# Patient Record
Sex: Female | Born: 1970 | Race: White | Hispanic: No | Marital: Married | Smoking: Never smoker
Health system: Southern US, Community
[De-identification: ages and names within clinical notes are randomized; demographics above are authoritative.]

## PROBLEM LIST (undated history)

## (undated) DIAGNOSIS — M8580 Other specified disorders of bone density and structure, unspecified site: Secondary | ICD-10-CM

## (undated) HISTORY — PX: OTHER SURGICAL HISTORY: SHX169

## (undated) HISTORY — DX: Other specified disorders of bone density and structure, unspecified site: M85.80

---

## 2000-01-21 ENCOUNTER — Other Ambulatory Visit: Admission: RE | Admit: 2000-01-21 | Discharge: 2000-01-21 | Payer: Self-pay | Admitting: Obstetrics & Gynecology

## 2001-01-24 ENCOUNTER — Other Ambulatory Visit: Admission: RE | Admit: 2001-01-24 | Discharge: 2001-01-24 | Payer: Self-pay | Admitting: Obstetrics & Gynecology

## 2001-10-20 ENCOUNTER — Other Ambulatory Visit: Admission: RE | Admit: 2001-10-20 | Discharge: 2001-10-20 | Payer: Self-pay | Admitting: Obstetrics & Gynecology

## 2002-05-21 ENCOUNTER — Inpatient Hospital Stay (HOSPITAL_COMMUNITY): Admission: AD | Admit: 2002-05-21 | Discharge: 2002-05-24 | Payer: Self-pay | Admitting: Obstetrics & Gynecology

## 2002-06-01 ENCOUNTER — Encounter: Admission: RE | Admit: 2002-06-01 | Discharge: 2002-06-01 | Payer: Self-pay | Admitting: Obstetrics & Gynecology

## 2002-06-01 ENCOUNTER — Encounter: Payer: Self-pay | Admitting: Obstetrics & Gynecology

## 2002-06-29 ENCOUNTER — Other Ambulatory Visit: Admission: RE | Admit: 2002-06-29 | Discharge: 2002-06-29 | Payer: Self-pay | Admitting: Obstetrics & Gynecology

## 2003-02-15 ENCOUNTER — Other Ambulatory Visit: Admission: RE | Admit: 2003-02-15 | Discharge: 2003-02-15 | Payer: Self-pay | Admitting: Family Medicine

## 2004-03-13 ENCOUNTER — Encounter: Admission: RE | Admit: 2004-03-13 | Discharge: 2004-03-13 | Payer: Self-pay | Admitting: Obstetrics & Gynecology

## 2004-09-22 ENCOUNTER — Inpatient Hospital Stay (HOSPITAL_COMMUNITY): Admission: AD | Admit: 2004-09-22 | Discharge: 2004-09-24 | Payer: Self-pay | Admitting: Obstetrics & Gynecology

## 2010-02-24 ENCOUNTER — Encounter: Admission: RE | Admit: 2010-02-24 | Discharge: 2010-02-24 | Payer: Self-pay | Admitting: Obstetrics & Gynecology

## 2010-02-27 ENCOUNTER — Encounter: Admission: RE | Admit: 2010-02-27 | Discharge: 2010-02-27 | Payer: Self-pay | Admitting: Obstetrics & Gynecology

## 2010-10-16 NOTE — H&P (Signed)
NAMESOPHYA, VANBLARCOM                 ACCOUNT NO.:  [id number]   MEDICAL RECORD NO.:  [id number]          PATIENT TYPE:  INP   LOCATION:  9166                          FACILITY:  [facility]   PHYSICIAN:  Lenoard Aden, M.D.DATE OF BIRTH:  1970-10-07   DATE OF ADMISSION:  09/22/2004  DATE OF DISCHARGE:                                HISTORY & PHYSICAL   CHIEF COMPLAINT:  Labor.   HISTORY OF PRESENT ILLNESS:  A 40 year old white female, G2, P1, at 40-1/7th-  week, in active labor.   ALLERGIES:  PENICILLIN.   MEDICATIONS:  Prenatal vitamins.   She has a history of:  1.  Vaginal delivery x 1.  2.  Postpartum depression.   FAMILY HISTORY:  Stroke, osteoarthritis, osteoporosis, thyroid disease, and  congestive heart failure.   PRENATAL LAB DATA:  Blood type O positive.  __________ negative.  Rubella  immune.  Hepatitis HIV nonreactive.  GBS is negative.   PHYSICAL EXAMINATION:  GENERAL:  Well-developed, well-nourished, white  female in no acute distress.  HEENT:  Normal.  LUNGS:  Clear.  HEART:  Regular.  ABDOMEN:  Soft, gravida, nontender.  PELVIC:  The cervix is fully dilated.  LOA +2.  EXTREMITIES:  __________.  NEUROLOGIC:  Nonfocal.   IMPRESSION:  Term intrauterine pregnancy in active labor.   PLAN:  Anticipate attempts at vaginal delivery.     RJT/MEDQ  D:  09/22/2004  T:  09/22/2004  Job:  [id number]   cc:   Ma Hillock

## 2010-10-16 NOTE — Op Note (Signed)
   Kristi [name], Kristi [name]                             ACCOUNT NO.:  [id number]   MEDICAL RECORD NO.:  [id number]                   PATIENT TYPE:  INP   LOCATION:  9175                                 FACILITY:  [facility]   PHYSICIAN:  Ephesus B. Earlene Plater, M.D.               DATE OF BIRTH:  Dec 30, 1970   DATE OF PROCEDURE:  05/22/2002  DATE OF DISCHARGE:                                 OPERATIVE REPORT   PREOPERATIVE DIAGNOSES:  1. Term intrauterine pregnancy with spontaneous labor.  2. Severe repetitive variable decelerations.   PROCEDURE:  Low vacuum assisted vaginal delivery with the Mityvac mushroom  cup.   SURGEON:  Chester Holstein. Earlene Plater, M.D.   ANESTHESIA:  Epidural.   FINDINGS:  Viable female infant with associated body cord.  Apgars 8 and 10.  Compound presentation with an arm.   INDICATIONS:  The patient presented in spontaneous labor.  Progressed to  complete/complete +2 and with pushing was noted to have severe repetitive  variable decelerations.  I therefore recommend we proceed with operative  vaginal delivery.   PROCEDURE:  The patient was in labor and delivery room with epidural  anesthesia.  Placed in the lithotomy position.  Bladder was emptied with red  rubber catheter.  Examination under anesthesia confirmed the position as  outlined above.  The Mityvac mushroom cup was applied to the fetal vertex in  the midline just anterior to the posterior fontanelle.  With three  contractions and no pop-off the fetal vertex was delivered without  difficulty.  The pressure was released between contractions.  Compound  presentation with posterior arm was noted.  A second degree episiotomy was  made to facilitate the delivery.  This extended to a partial third degree.  The remainder of the infant was delivered without difficulty, the cord  clamped and cut, and the infant handed off to the waiting NICU staff.   Placenta was expelled spontaneously and the perineum inspected.  There was a  partial third degree extension.  This portion was repaired with figure-of-  eight interrupted 2-0 Vicryl sutures.  The remainder of the repair was  closed in a standard fashion with 2-0 and 3-0 Vicryl.   The patient tolerated procedure well and there were no apparent injuries to  the newborn.                                               Gerri Spore B. Earlene Plater, M.D.     WBD/MEDQ  D:  05/22/2002  T:  05/22/2002  Job:  [id number]

## 2010-10-16 NOTE — H&P (Signed)
   NAMEAARIN, [name]                             ACCOUNT NO.:  [id number]   MEDICAL RECORD NO.:  [id number]                   PATIENT TYPE:  INP   LOCATION:  9175                                 [facility]ACILITY:  [facility]   PHYSICIAN:  Addison B. Earlene Plater, M.D.               DATE OF BIRTH:  03/06/1971   DATE OF ADMISSION:  05/22/2002  DATE OF DISCHARGE:                                HISTORY & PHYSICAL   ADMISSION DIAGNOSES:  1. Term intrauterine pregnancy.  2. Spontaneous labor.   HISTORY OF PRESENT ILLNESS:  A 40 year old white female gravida 1, para 0 at  39+ weeks presents in spontaneous labor.  Was evaluated in maternity  admissions and progressed from 1 cm to 5 cm after one hour of walking.  Subsequently admitted for management of labor.  Prenatal care [facility], Dr. Seymour Bars, uncomplicated.   PAST MEDICAL HISTORY:  1. Asthma, mild, no recent symptoms.  2. Migraines.   PAST SURGICAL HISTORY:  Wisdom teeth.   FAMILY HISTORY:  Heart disease, diabetes, thyroid dysfunction.   SOCIAL HISTORY:  No alcohol, tobacco, or other drugs.   MEDICATIONS:  Prenatal vitamins.   ALLERGIES:  PENICILLIN causes a rash.   PRENATAL LABORATORIES:  O+.  Rubella equivocal.  Glucola normal.  Group B  Strep negative.   PHYSICAL EXAMINATION:  VITAL SIGNS:  The patient was noted to be afebrile  with stable vitals on admission.  HEART:  Regular rate and rhythm.  LUNGS:  Clear to auscultation.  ABDOMEN:  Gravid.  Fundal height and estimation to weight around 8 pounds.  PELVIC:  Cervical examination as outlined above.  Fetal heart rate in the  140s and reactive with contractions every five to six minutes.    ASSESSMENT:  1. Term intrauterine pregnancy.  2. Spontaneous labor.   PLAN:  Admission for management of labor.                                               Gerri Spore B. Earlene Plater, M.D.    WBD/MEDQ  D:  05/22/2002  T:  05/22/2002  Job:  [id number]

## 2011-02-03 ENCOUNTER — Other Ambulatory Visit: Payer: Self-pay | Admitting: Obstetrics & Gynecology

## 2011-02-03 DIAGNOSIS — Z1231 Encounter for screening mammogram for malignant neoplasm of breast: Secondary | ICD-10-CM

## 2011-03-05 ENCOUNTER — Ambulatory Visit
Admission: RE | Admit: 2011-03-05 | Discharge: 2011-03-05 | Disposition: A | Discharge status: Home / Self Care | Payer: BC Managed Care – PPO | Source: Ambulatory Visit | Attending: Obstetrics & Gynecology | Admitting: Obstetrics & Gynecology

## 2011-03-05 DIAGNOSIS — Z1231 Encounter for screening mammogram for malignant neoplasm of breast: Secondary | ICD-10-CM

## 2012-02-01 ENCOUNTER — Other Ambulatory Visit: Payer: Self-pay | Admitting: Obstetrics & Gynecology

## 2012-02-01 DIAGNOSIS — Z1231 Encounter for screening mammogram for malignant neoplasm of breast: Secondary | ICD-10-CM

## 2012-03-07 ENCOUNTER — Ambulatory Visit
Admission: RE | Admit: 2012-03-07 | Discharge: 2012-03-07 | Disposition: A | Discharge status: Home / Self Care | Payer: BC Managed Care – PPO | Source: Ambulatory Visit | Attending: Obstetrics & Gynecology | Admitting: Obstetrics & Gynecology

## 2012-03-07 DIAGNOSIS — Z1231 Encounter for screening mammogram for malignant neoplasm of breast: Secondary | ICD-10-CM

## 2013-02-21 ENCOUNTER — Other Ambulatory Visit: Payer: Self-pay

## 2013-02-21 DIAGNOSIS — Z1231 Encounter for screening mammogram for malignant neoplasm of breast: Secondary | ICD-10-CM

## 2013-03-12 ENCOUNTER — Ambulatory Visit
Admission: RE | Admit: 2013-03-12 | Discharge: 2013-03-12 | Disposition: A | Discharge status: Home / Self Care | Payer: BC Managed Care – PPO | Source: Ambulatory Visit

## 2013-03-12 DIAGNOSIS — Z1231 Encounter for screening mammogram for malignant neoplasm of breast: Secondary | ICD-10-CM

## 2013-08-15 ENCOUNTER — Other Ambulatory Visit: Payer: Self-pay | Admitting: Family

## 2013-08-15 DIAGNOSIS — R10811 Right upper quadrant abdominal tenderness: Secondary | ICD-10-CM

## 2013-08-21 ENCOUNTER — Ambulatory Visit
Admission: RE | Admit: 2013-08-21 | Discharge: 2013-08-21 | Disposition: A | Discharge status: Home / Self Care | Payer: BC Managed Care – PPO | Source: Ambulatory Visit | Attending: Family | Admitting: Family

## 2013-08-21 DIAGNOSIS — R10811 Right upper quadrant abdominal tenderness: Secondary | ICD-10-CM

## 2014-02-18 ENCOUNTER — Other Ambulatory Visit: Payer: Self-pay

## 2014-02-18 DIAGNOSIS — Z1231 Encounter for screening mammogram for malignant neoplasm of breast: Secondary | ICD-10-CM

## 2014-03-15 ENCOUNTER — Ambulatory Visit: Payer: BC Managed Care – PPO

## 2014-03-22 ENCOUNTER — Ambulatory Visit
Admission: RE | Admit: 2014-03-22 | Discharge: 2014-03-22 | Disposition: A | Discharge status: Home / Self Care | Payer: BC Managed Care – PPO | Source: Ambulatory Visit

## 2014-03-22 DIAGNOSIS — Z1231 Encounter for screening mammogram for malignant neoplasm of breast: Secondary | ICD-10-CM

## 2015-02-14 ENCOUNTER — Other Ambulatory Visit: Payer: Self-pay

## 2015-02-14 DIAGNOSIS — Z1231 Encounter for screening mammogram for malignant neoplasm of breast: Secondary | ICD-10-CM

## 2015-03-24 ENCOUNTER — Ambulatory Visit: Payer: Self-pay

## 2015-10-23 IMAGING — US US ABDOMEN LIMITED
1 series · 14 of 25 positions shown · non-contrast
Comparison: None.

CLINICAL DATA: Right upper quadrant abdominal tenderness

EXAM:
US ABDOMEN LIMITED - RIGHT UPPER QUADRANT

[Series 1: us abdomen limited · 0.24mm/px · 14 of 73 slices shown]
[im 1/73]
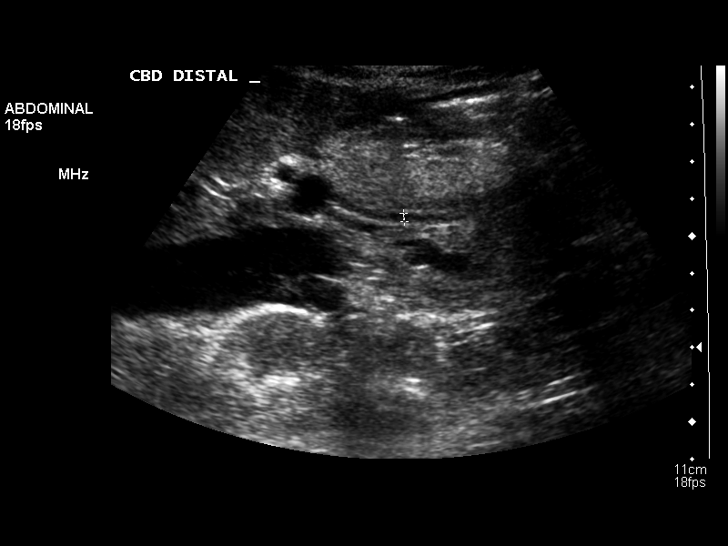
[im 7/73]
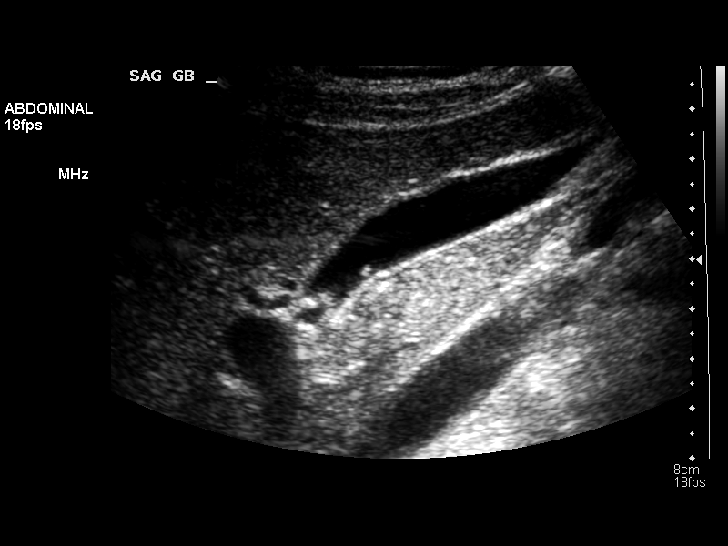
[im 13/73]
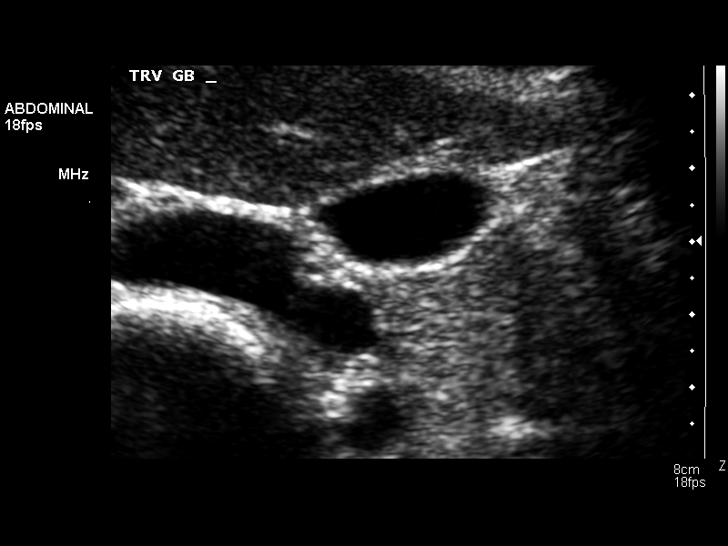
[im 19/73]
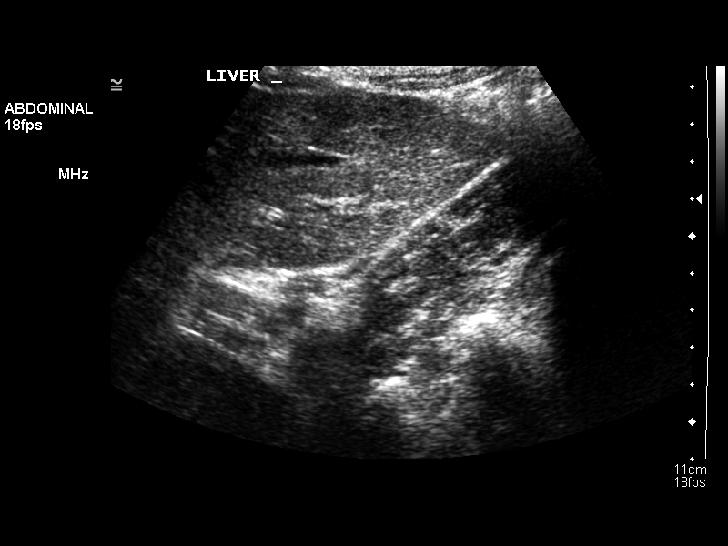
[im 25/73]
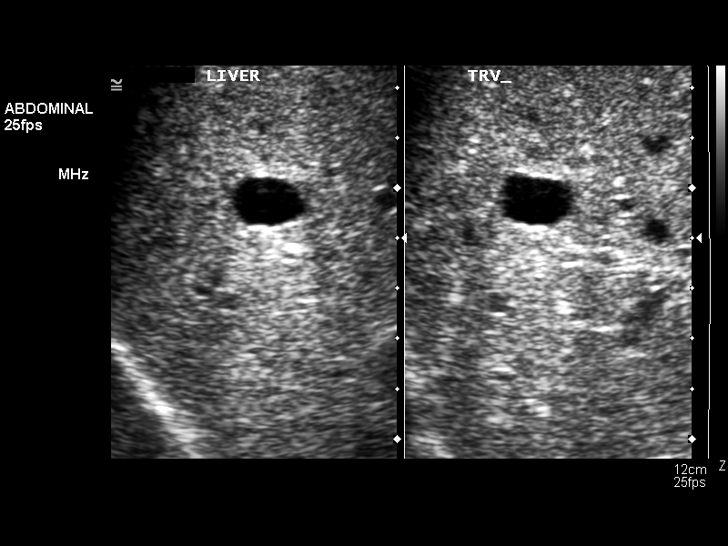
[im 28/73]
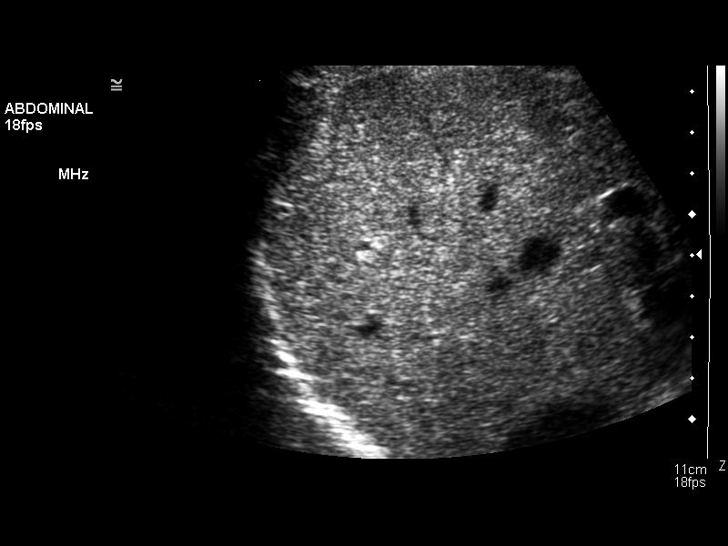
[im 34/73]
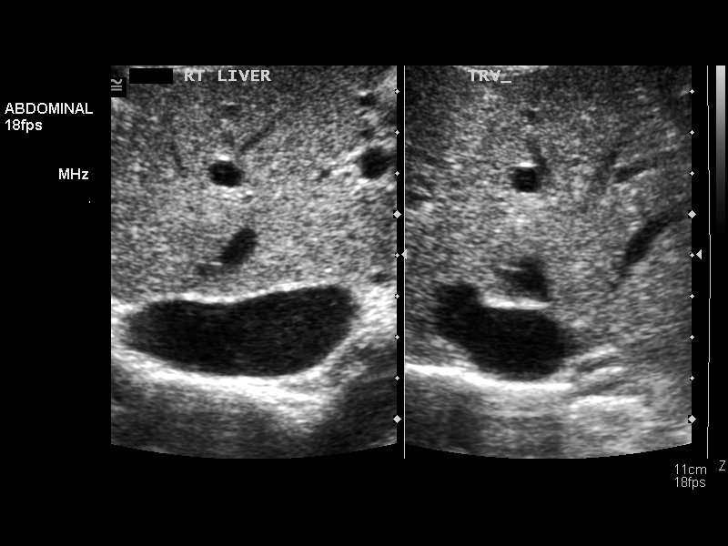
[im 40/73]
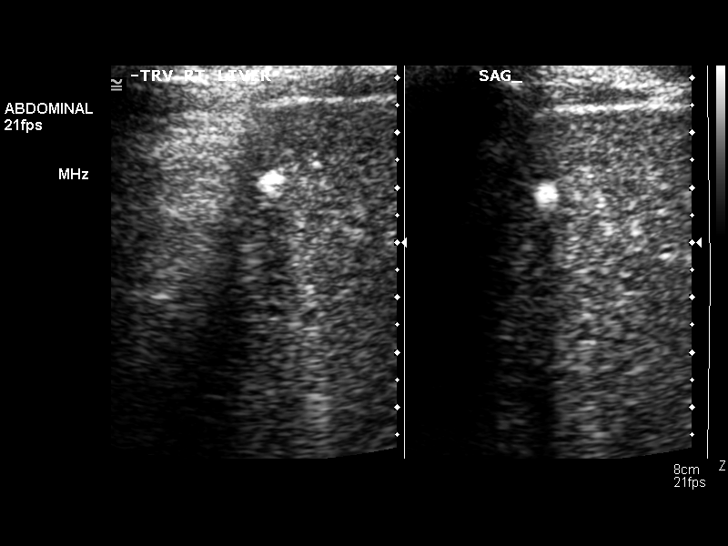
[im 46/73]
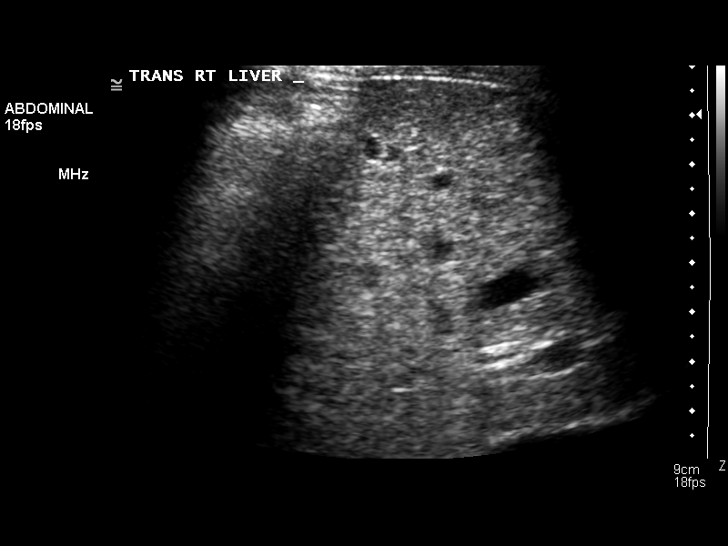
[im 49/73]
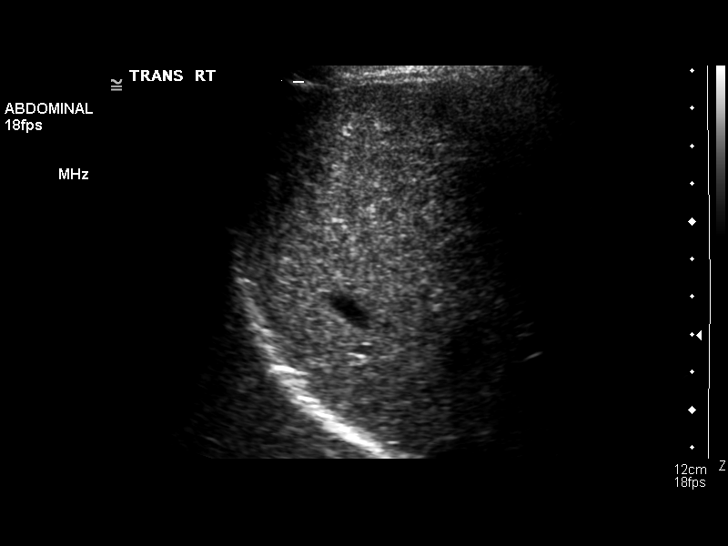
[im 55/73]
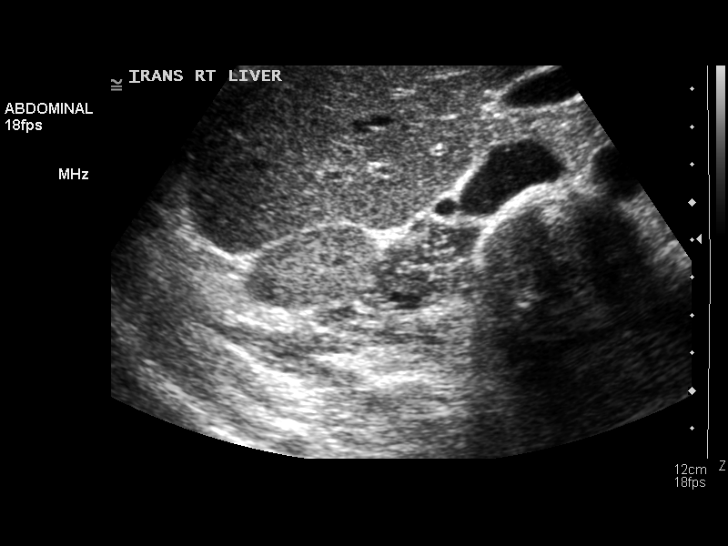
[im 61/73]
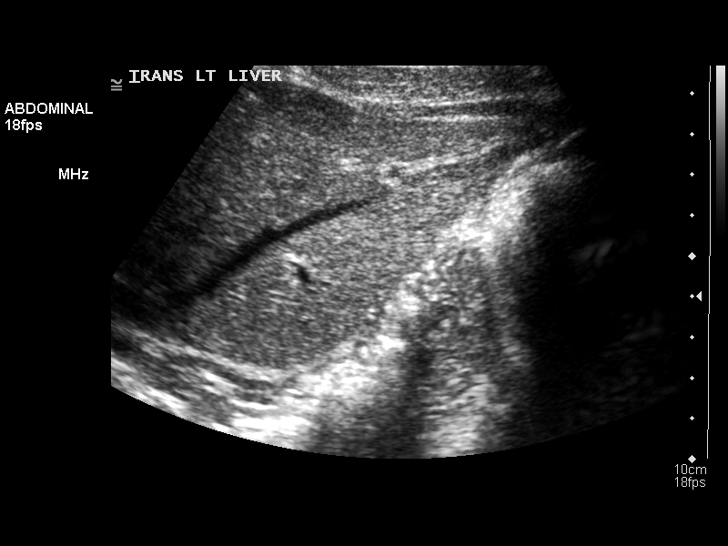
[im 67/73]
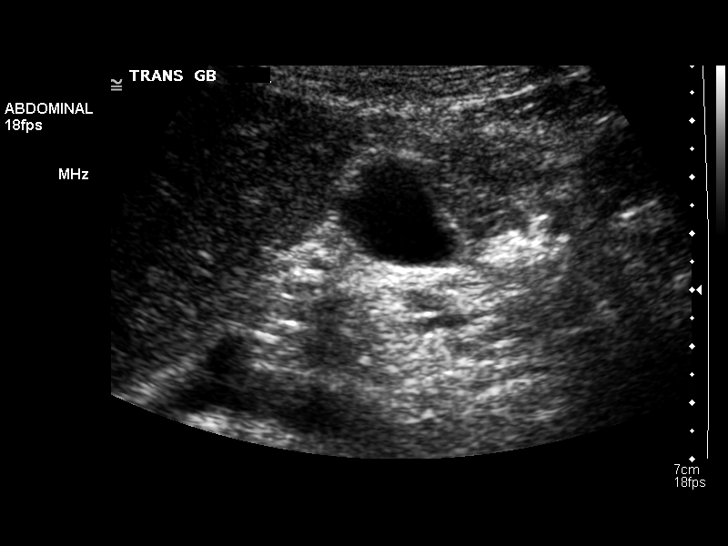
[im 73/73]
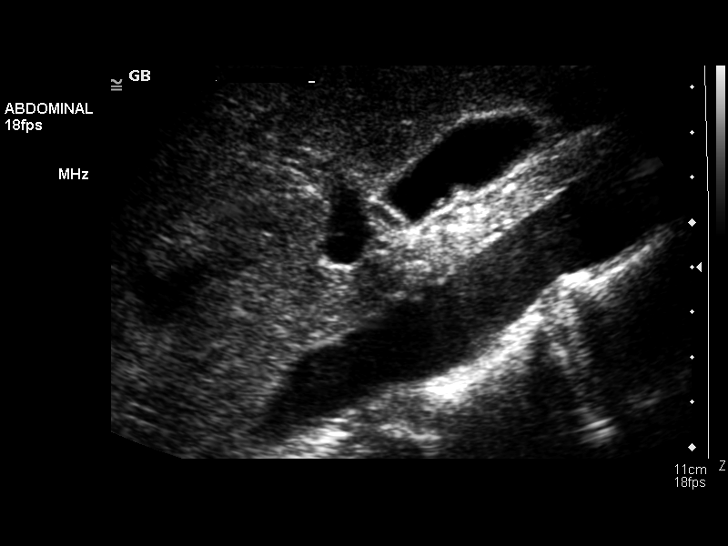

[14 of 25 positions shown; findings below may reference images not displayed]

FINDINGS: Gallbladder:

There are 3 non mobile echodensity is within the gallbladder with no
shadowing common most consistent with gallbladder polyps no larger
than 4 mm in diameter. No definite gallstones are seen, and there is
no pain over the gallbladder with compression.

Common bile duct:

Diameter: The common bile duct is normal measuring 2.2 mm in
diameter distally.

Liver:

The liver is slightly inhomogeneous which may indicate mild fatty
infiltration. Several liver cysts are present, the largest in the
right lobe of 1.5 cm. Also a 6 mm calcification is noted in the
right lobe with acoustical shadowing consistent with a hepatic
granuloma.
IMPRESSION: 1. Three gallbladder polyps.  No definite gallstones.
2. Inhomogeneous liver may represent mild fatty infiltration.
3. Multiple liver cysts.

## 2016-11-10 ENCOUNTER — Ambulatory Visit (INDEPENDENT_AMBULATORY_CARE_PROVIDER_SITE_OTHER): Payer: BLUE CROSS/BLUE SHIELD | Admitting: Obstetrics & Gynecology

## 2016-11-10 ENCOUNTER — Telehealth: Payer: Self-pay | Admitting: *Deleted

## 2016-11-10 ENCOUNTER — Encounter: Payer: Self-pay | Admitting: Obstetrics & Gynecology

## 2016-11-10 VITALS — BP 112/70

## 2016-11-10 DIAGNOSIS — N898 Other specified noninflammatory disorders of vagina: Secondary | ICD-10-CM | POA: Diagnosis not present

## 2016-11-10 DIAGNOSIS — L292 Pruritus vulvae: Secondary | ICD-10-CM

## 2016-11-10 LAB — WET PREP FOR TRICH, YEAST, CLUE
Clue Cells Wet Prep HPF POC: NONE SEEN
Trich, Wet Prep: NONE SEEN

## 2016-11-10 MED ORDER — TERCONAZOLE 0.8 % VA CREA: 1.0000 | TOPICAL_CREAM | Freq: Every day | VAGINAL | 1 refills | Status: AC

## 2016-11-10 NOTE — Progress Notes (Signed)
    Kristi [name] August 06, 1970 [id number]        46 y.o.  G2P2  Married.    RP:  Vulvovaginal itching x a few days with d/c.    HPI:  Vaginal d/c with itching at vulva and in vaginal.  Had a little Terconazole cream left and used it on vulva which helped, but Sxs still present.  No pelvic pain.  Well on BCPs.  No abnormal bleeding.  [facility].    Past medical history,surgical history, problem list, medications, allergies, family history and social history were all reviewed and documented in the EPIC chart.  Directed ROS with pertinent positives and negatives documented in the history of present illness/assessment and plan.  Exam:  Vitals:   11/10/16 1610  BP: 112/70   General appearance:  Normal  Gyn exam:  Vulva Mild erythema                     Speculum:  Vaginal d/c c/w yeast.  Wet prep done.  Assessment/Plan:  46 y.o.   1. Vaginal discharge Typical of yeast infection  - WET PREP FOR TRICH, YEAST, CLUE:  Yeasts present  2. Vulvovaginal itching Vulvovaginal yeast infection.  Terconazole 0.8% HS x 3 prescribed.    Counseling on above issues >50% x 15 minutes.   Genia DelMarie-Lyne Skyy Mcknight MD, 4:18 PM 11/10/2016

## 2016-11-10 NOTE — Telephone Encounter (Signed)
Pt called c/o yeast infection itching and white discharge x 3 days, states she had old Rx for Terazol cream you had prescribed only had 2 days left, Pt paper chart is not here yet, she is going to have it sent.I explained to her not sure if you would approve medication with no chart, but I will check. Pt [facility], pt asked if you would be willing to prescribe Terazol cream again? Please advise

## 2016-11-10 NOTE — Patient Instructions (Signed)
1. Vaginal discharge Typical of yeast infection  - WET PREP FOR TRICH, YEAST, CLUE:  Yeasts present  2. Vulvovaginal itching Vulvovaginal yeast infection.  Terconazole 0.8% HS x 3 prescribed.    Kristi [name], it was a pleasure to see you today!

## 2016-12-10 ENCOUNTER — Encounter: Payer: Self-pay | Admitting: Obstetrics & Gynecology

## 2016-12-10 ENCOUNTER — Ambulatory Visit (INDEPENDENT_AMBULATORY_CARE_PROVIDER_SITE_OTHER): Payer: BLUE CROSS/BLUE SHIELD | Admitting: Obstetrics & Gynecology

## 2016-12-10 VITALS — BP 120/80

## 2016-12-10 DIAGNOSIS — L298 Other pruritus: Secondary | ICD-10-CM

## 2016-12-10 DIAGNOSIS — N898 Other specified noninflammatory disorders of vagina: Secondary | ICD-10-CM

## 2016-12-10 LAB — WET PREP FOR TRICH, YEAST, CLUE
Clue Cells Wet Prep HPF POC: NONE SEEN
Trich, Wet Prep: NONE SEEN
Yeast Wet Prep HPF POC: NONE SEEN

## 2016-12-10 NOTE — Progress Notes (Signed)
    Kristi [name] 1970/08/03 [id number]        46 y.o.  G2P2 Married  RP:  Recurrent vaginal d/c with itching  HPI:  Yeast vaginitis with pos Wet prep 11/10/2016, treated with Terazol cream.  Got better x 1 week and then started being symptomatic again and retreated with Terazol.  Again, better x about 1-2 weeks and vaginal/vulvar itching with increased whitish secretions reappeared x >1 week.  Takes Lactobacilli powder per mouth.  Read the book on Yeast infections...  Not sexually active x many years.  Past medical history,surgical history, problem list, medications, allergies, family history and social history were all reviewed and documented in the EPIC chart.  Directed ROS with pertinent positives and negatives documented in the history of present illness/assessment and plan.  Exam:  Vitals:   12/10/16 1455  BP: 120/80   General appearance:  Normal  Gyn exam:  Vulva normal, no erythema, no lesion, no discharge seen.                     Speculum:  Mild white vaginal d/c.  Cervix/vaginal mucosa normal.  Wet prep done.  Assessment/Plan:  46 y.o.   1. Vaginal discharge Wet prep negative.  Reassured, no yeast vulvovaginitis.  Normal gyn exam.  Will try to stop focusing on Yeast symptoms and see if feels better.  2. Vaginal itching Minimal currently.  Counseling on above issues >50% x 15 minutes.  Kristi DelMarie-Lyne Nanette Wirsing MD, 2:59 PM 12/10/2016

## 2016-12-10 NOTE — Patient Instructions (Signed)
1. Vaginal discharge Wet prep negative.  Reassured, no yeast vulvovaginitis.  Normal gyn exam.  Will try to stop focusing on Yeast symptoms and see if feels better.  2. Vaginal itching Minimal currently.  Kristi [name], good to see you today!

## 2017-07-25 ENCOUNTER — Other Ambulatory Visit: Payer: Self-pay | Admitting: Obstetrics & Gynecology

## 2017-07-25 DIAGNOSIS — Z1231 Encounter for screening mammogram for malignant neoplasm of breast: Secondary | ICD-10-CM

## 2017-08-22 ENCOUNTER — Ambulatory Visit
Admission: RE | Admit: 2017-08-22 | Discharge: 2017-08-22 | Disposition: A | Discharge status: Home / Self Care | Payer: BLUE CROSS/BLUE SHIELD | Source: Ambulatory Visit | Attending: Obstetrics & Gynecology | Admitting: Obstetrics & Gynecology

## 2017-08-22 DIAGNOSIS — Z1231 Encounter for screening mammogram for malignant neoplasm of breast: Secondary | ICD-10-CM

## 2017-09-26 ENCOUNTER — Telehealth: Payer: Self-pay | Admitting: *Deleted

## 2017-09-26 MED ORDER — NORETHIN ACE-ETH ESTRAD-FE 1-20 MG-MCG PO TABS: 1.0000 | ORAL_TABLET | Freq: Every day | ORAL | 0 refills | Status: DC

## 2017-09-26 NOTE — Telephone Encounter (Signed)
Patient called requesting refill on Junel Fe 1/20 tablet has annual scheduled on 10/12/17. Rx sent.

## 2017-10-12 ENCOUNTER — Encounter: Payer: Self-pay | Admitting: Obstetrics & Gynecology

## 2017-10-12 ENCOUNTER — Ambulatory Visit (INDEPENDENT_AMBULATORY_CARE_PROVIDER_SITE_OTHER): Payer: BLUE CROSS/BLUE SHIELD | Admitting: Obstetrics & Gynecology

## 2017-10-12 VITALS — BP 108/70 | Ht 64.0 in | Wt 114.0 lb

## 2017-10-12 DIAGNOSIS — Z3041 Encounter for surveillance of contraceptive pills: Secondary | ICD-10-CM | POA: Diagnosis not present

## 2017-10-12 DIAGNOSIS — Z01419 Encounter for gynecological examination (general) (routine) without abnormal findings: Secondary | ICD-10-CM | POA: Diagnosis not present

## 2017-10-12 MED ORDER — NORETHIN ACE-ETH ESTRAD-FE 1-20 MG-MCG PO TABS: 1.0000 | ORAL_TABLET | Freq: Every day | ORAL | 4 refills | Status: DC

## 2017-10-12 NOTE — Progress Notes (Signed)
Kristi [name] 1970-11-22 [id number]   History:    47 y.o. G2P2L2 Married.  Children 43 and 40 yo.  RP:  Established patient presenting for annual gyn exam   HPI: Well on BCP Junel Fe 1/20, using continuously.  No BTB.  No pelvic pain.  No pain with IC.  Urine/BMs wnl.  Breasts wnl.  Exercises regularly.  BMI 19.57.  Health labs with Fam MD.  Past medical history,surgical history, family history and social history were all reviewed and documented in the EPIC chart.  Gynecologic History No LMP recorded. (Menstrual status: Oral contraceptives). Contraception: OCP (estrogen/progesterone) Last Pap: Probably 2018, but last documented from [facility] records is 2016 negative. Last mammogram: 05/2016. Results were: Benign Bone Density: Never Colonoscopy: Never  Obstetric History OB History  Gravida Para Term Preterm AB Living  SAB TAB Ectopic Multiple Live Births               # Outcome Date GA Lbr Len/2nd Weight Sex Delivery Anes PTL Lv  2 Para           1 Para              ROS: A ROS was performed and pertinent positives and negatives are included in the history.  GENERAL: No fevers or chills. HEENT: No change in vision, no earache, sore throat or sinus congestion. NECK: No pain or stiffness. CARDIOVASCULAR: No chest pain or pressure. No palpitations. PULMONARY: No shortness of breath, cough or wheeze. GASTROINTESTINAL: No abdominal pain, nausea, vomiting or diarrhea, melena or bright red blood per rectum. GENITOURINARY: No urinary frequency, urgency, hesitancy or dysuria. MUSCULOSKELETAL: No joint or muscle pain, no back pain, no recent trauma. DERMATOLOGIC: No rash, no itching, no lesions. ENDOCRINE: No polyuria, polydipsia, no heat or cold intolerance. No recent change in weight. HEMATOLOGICAL: No anemia or easy bruising or bleeding. NEUROLOGIC: No headache, seizures, numbness, tingling or weakness. PSYCHIATRIC: No depression, no loss of interest in normal activity or  change in sleep pattern.     Exam:   BP 108/70   Ht  (1.626 m)   Wt 114 lb (51.7 kg)   BMI 19.57 kg/m   Body mass index is 19.57 kg/m.  General appearance : Well developed well nourished female. No acute distress HEENT: Eyes: no retinal hemorrhage or exudates,  Neck supple, trachea midline, no carotid bruits, no thyroidmegaly Lungs: Clear to auscultation, no rhonchi or wheezes, or rib retractions  Heart: Regular rate and rhythm, no murmurs or gallops Breast:Examined in sitting and supine position were symmetrical in appearance, no palpable masses or tenderness,  no skin retraction, no nipple inversion, no nipple discharge, no skin discoloration, no axillary or supraclavicular lymphadenopathy Abdomen: no palpable masses or tenderness, no rebound or guarding Extremities: no edema or skin discoloration or tenderness  Pelvic: Vulva: Normal             Vagina: No gross lesions or discharge  Cervix: No gross lesions or discharge.  Pap reflex done.  Uterus  AV, normal size, shape and consistency, non-tender and mobile  Adnexa  Without masses or tenderness  Anus: Normal   Assessment/Plan:  47 y.o. female for annual exam   1. Encounter for routine gynecological examination with Papanicolaou smear of cervix Normal gynecologic exam.  Pap reflex done.  Breast exam normal.  Patient will schedule screening mammogram now.  Health labs with family physician.  2. Encounter for surveillance of  contraceptive pills Well on continuous Junel 1/20 FE 1/20.  No contraindication.  Same birth control pill represcribed.  Other orders - Multiple Vitamin (MULTIVITAMIN) tablet; Take 1 tablet by mouth daily. - norethindrone-ethinyl estradiol (JUNEL FE 1/20) 1-20 MG-MCG tablet; Take 1 tablet by mouth daily.  Genia Del MD, 4:15 PM 10/12/2017

## 2017-10-13 LAB — PAP IG W/ RFLX HPV ASCU

## 2017-10-14 ENCOUNTER — Encounter: Payer: Self-pay | Admitting: Obstetrics & Gynecology

## 2017-10-14 NOTE — Patient Instructions (Signed)
1. Encounter for routine gynecological examination with Papanicolaou smear of cervix Normal gynecologic exam.  Pap reflex done.  Breast exam normal.  Patient will schedule screening mammogram now.  Health labs with family physician.  2. Encounter for surveillance of contraceptive pills Well on continuous Junel 1/20 FE 1/20.  No contraindication.  Same birth control pill represcribed.  Other orders - Multiple Vitamin (MULTIVITAMIN) tablet; Take 1 tablet by mouth daily. - norethindrone-ethinyl estradiol (JUNEL FE 1/20) 1-20 MG-MCG tablet; Take 1 tablet by mouth daily.  Kristi [name], it was a pleasure seeing you today!  I will inform you of your results as soon as they are available.

## 2018-08-18 ENCOUNTER — Other Ambulatory Visit: Payer: Self-pay | Admitting: Obstetrics & Gynecology

## 2018-08-18 DIAGNOSIS — Z1231 Encounter for screening mammogram for malignant neoplasm of breast: Secondary | ICD-10-CM

## 2018-09-25 ENCOUNTER — Ambulatory Visit: Payer: BLUE CROSS/BLUE SHIELD

## 2018-11-02 ENCOUNTER — Other Ambulatory Visit: Payer: Self-pay

## 2018-11-02 ENCOUNTER — Ambulatory Visit
Admission: RE | Admit: 2018-11-02 | Discharge: 2018-11-02 | Disposition: A | Discharge status: Home / Self Care | Payer: 59 | Source: Ambulatory Visit | Attending: Obstetrics & Gynecology | Admitting: Obstetrics & Gynecology

## 2018-11-02 DIAGNOSIS — Z1231 Encounter for screening mammogram for malignant neoplasm of breast: Secondary | ICD-10-CM

## 2018-11-09 ENCOUNTER — Other Ambulatory Visit: Payer: Self-pay | Admitting: Obstetrics & Gynecology

## 2018-11-09 NOTE — Telephone Encounter (Signed)
Annual on 11/28/18

## 2018-11-27 ENCOUNTER — Other Ambulatory Visit: Payer: Self-pay

## 2018-11-28 ENCOUNTER — Ambulatory Visit (INDEPENDENT_AMBULATORY_CARE_PROVIDER_SITE_OTHER): Payer: 59 | Admitting: Obstetrics & Gynecology

## 2018-11-28 ENCOUNTER — Encounter: Payer: Self-pay | Admitting: Obstetrics & Gynecology

## 2018-11-28 VITALS — BP 116/70 | Ht 64.5 in | Wt 117.0 lb

## 2018-11-28 DIAGNOSIS — Z3041 Encounter for surveillance of contraceptive pills: Secondary | ICD-10-CM

## 2018-11-28 DIAGNOSIS — Z01419 Encounter for gynecological examination (general) (routine) without abnormal findings: Secondary | ICD-10-CM

## 2018-11-28 MED ORDER — NORETHIN ACE-ETH ESTRAD-FE 1-20 MG-MCG PO TABS: 1.0000 | ORAL_TABLET | Freq: Every day | ORAL | 5 refills | Status: DC

## 2018-11-28 NOTE — Progress Notes (Signed)
Kristi [name] [birth year] [id number]   History:    48 y.o. G2P2L2 Married.  2 daughters 68 and 28 yo.  RP:  Established patient presenting for annual gyn exam   HPI: Well on Junel 1/20 continuous use.  Rare light menses.  No pelvic pain.  Abstinent.  Normal vaginal secretions.  Urine/BMs normal.  Breasts normal.  BMI 19.77.  Good fitness.  Healthy nutrition.  Health Labs with Fam MD.  Past medical history,surgical history, family history and social history were all reviewed and documented in the EPIC chart.  Gynecologic History No LMP recorded. (Menstrual status: Oral contraceptives). Contraception: OCP (estrogen/progesterone)/Abstinent Last Pap: 09/2017. Results were: Negative Last mammogram: 10/2018. Results were: Negative Bone Density: Never Colonoscopy: Never  Obstetric History OB History  Gravida Para Term Preterm AB Living  2 2       2   SAB TAB Ectopic Multiple Live Births               # Outcome Date GA Lbr Len/2nd Weight Sex Delivery Anes PTL Lv  2 Para           1 Para              ROS: A ROS was performed and pertinent positives and negatives are included in the history.  GENERAL: No fevers or chills. HEENT: No change in vision, no earache, sore throat or sinus congestion. NECK: No pain or stiffness. CARDIOVASCULAR: No chest pain or pressure. No palpitations. PULMONARY: No shortness of breath, cough or wheeze. GASTROINTESTINAL: No abdominal pain, nausea, vomiting or diarrhea, melena or bright red blood per rectum. GENITOURINARY: No urinary frequency, urgency, hesitancy or dysuria. MUSCULOSKELETAL: No joint or muscle pain, no back pain, no recent trauma. DERMATOLOGIC: No rash, no itching, no lesions. ENDOCRINE: No polyuria, polydipsia, no heat or cold intolerance. No recent change in weight. HEMATOLOGICAL: No anemia or easy bruising or bleeding. NEUROLOGIC: No headache, seizures, numbness, tingling or weakness. PSYCHIATRIC: No depression, no loss of interest in normal  activity or change in sleep pattern.     Exam:   BP 116/70   Ht 5' 4.5" (1.638 m)   Wt 117 lb (53.1 kg)   BMI 19.77 kg/m   Body mass index is 19.77 kg/m.  General appearance : Well developed well nourished female. No acute distress HEENT: Eyes: no retinal hemorrhage or exudates,  Neck supple, trachea midline, no carotid bruits, no thyroidmegaly Lungs: Clear to auscultation, no rhonchi or wheezes, or rib retractions  Heart: Regular rate and rhythm, no murmurs or gallops Breast:Examined in sitting and supine position were symmetrical in appearance, no palpable masses or tenderness,  no skin retraction, no nipple inversion, no nipple discharge, no skin discoloration, no axillary or supraclavicular lymphadenopathy Abdomen: no palpable masses or tenderness, no rebound or guarding Extremities: no edema or skin discoloration or tenderness  Pelvic: Vulva: Normal             Vagina: No gross lesions or discharge  Cervix: No gross lesions or discharge.  Pap reflex done.  Uterus  AV, normal size, shape and consistency, non-tender and mobile  Adnexa  Without masses or tenderness  Anus: Normal   Assessment/Plan:  48 y.o. female for annual exam   1. Encounter for routine gynecological examination with Papanicolaou smear of cervix Normal gynecologic exam.  Pap reflex done.  Breast exam normal.  Screening mammogram June 2020 was negative.  Health labs with family physician.  Body mass index stable at 19.77.  Continue  with fitness and healthy nutrition.  Recommend a screening colonoscopy at age 48.  Patient will call back when ready to schedule.  2. Encounter for surveillance of contraceptive pills Using birth control pill mainly for cycle control.  No contraindications continue.  Junel FE 1/20 represcribed.  Other orders - Biotin 10 MG CAPS; Take by mouth. - norethindrone-ethinyl estradiol (JUNEL FE 1/20) 1-20 MG-MCG tablet; Take 1 tablet by mouth daily. Continuous use  Genia DelMarie-Lyne Annalia Metzger  MD, 4:15 PM 11/28/2018

## 2018-11-28 NOTE — Patient Instructions (Signed)
1. Encounter for routine gynecological examination with Papanicolaou smear of cervix Normal gynecologic exam.  Pap reflex done.  Breast exam normal.  Screening mammogram June 2020 was negative.  Health labs with family physician.  Body mass index stable at 19.77.  Continue with fitness and healthy nutrition.  Recommend a screening colonoscopy at age 48.  Patient will call back when ready to schedule.  2. Encounter for surveillance of contraceptive pills Using birth control pill mainly for cycle control.  No contraindications continue.  Junel FE 1/20 represcribed.  Other orders - Biotin 10 MG CAPS; Take by mouth. - norethindrone-ethinyl estradiol (JUNEL FE 1/20) 1-20 MG-MCG tablet; Take 1 tablet by mouth daily. Continuous use  [name], it was a pleasure seeing you today!  I will inform you of your results as soon as they are available.

## 2018-11-29 NOTE — Addendum Note (Signed)
Addended by: CASTILLO, BLANCA A on: 11/29/2018 09:49 AM   Modules accepted: Orders  

## 2018-11-30 LAB — PAP IG W/ RFLX HPV ASCU

## 2019-02-03 ENCOUNTER — Other Ambulatory Visit: Payer: Self-pay | Admitting: Obstetrics & Gynecology

## 2019-12-04 ENCOUNTER — Other Ambulatory Visit: Payer: Self-pay

## 2019-12-04 ENCOUNTER — Other Ambulatory Visit: Payer: Self-pay | Admitting: Obstetrics & Gynecology

## 2019-12-04 ENCOUNTER — Ambulatory Visit (INDEPENDENT_AMBULATORY_CARE_PROVIDER_SITE_OTHER): Payer: 59 | Admitting: Obstetrics & Gynecology

## 2019-12-04 ENCOUNTER — Encounter: Payer: Self-pay | Admitting: Obstetrics & Gynecology

## 2019-12-04 VITALS — BP 116/72 | Ht 64.25 in | Wt 115.0 lb

## 2019-12-04 DIAGNOSIS — Z01419 Encounter for gynecological examination (general) (routine) without abnormal findings: Secondary | ICD-10-CM | POA: Diagnosis not present

## 2019-12-04 DIAGNOSIS — Z1231 Encounter for screening mammogram for malignant neoplasm of breast: Secondary | ICD-10-CM

## 2019-12-04 DIAGNOSIS — Z3041 Encounter for surveillance of contraceptive pills: Secondary | ICD-10-CM | POA: Diagnosis not present

## 2019-12-04 MED ORDER — NORETHIN ACE-ETH ESTRAD-FE 1-20 MG-MCG PO TABS: 1.0000 | ORAL_TABLET | Freq: Every day | ORAL | 4 refills | Status: DC

## 2019-12-05 ENCOUNTER — Encounter: Payer: Self-pay | Admitting: Obstetrics & Gynecology

## 2019-12-05 NOTE — Patient Instructions (Signed)
1. Well female exam with routine gynecological exam Normal gynecologic exam.  Pap test negative in 2020, no indication to repeat this year.  Breast exam normal.  Scheduled for screening mammogram this month July 2021.  Will schedule a screening colonoscopy through her family physician.  Good body mass index at 19.59.  Continue with fitness and healthy nutrition.  2. Encounter for surveillance of contraceptive pills Well on birth control pill with Junel FE 1/20.  No contraindication to continue.  Prescription sent to pharmacy.  Other orders - loratadine (CLARITIN) 10 MG tablet; Take 10 mg by mouth daily. - norethindrone-ethinyl estradiol (JUNEL FE 1/20) 1-20 MG-MCG tablet; Take 1 tablet by mouth daily.  Veora, it was a pleasure seeing you today!

## 2019-12-05 NOTE — Progress Notes (Signed)
Kristi [name] August 17, 1970 [id number]   History:    49 y.o. G2P2L2 Married.  2 daughters 88 and 59 yo.  RP:  Established patient presenting for annual gyn exam   HPI: Well on Junel 1/20 continuous use.  Rare light menses.  No pelvic pain.  Abstinent.  Normal vaginal secretions.  Abstinent.  Not a very good relationship with husband, but no abuse.  Urine/BMs normal.  Breasts normal.  BMI 19.59.  Good fitness.  Healthy nutrition.  Health Labs with Fam MD.   Past medical history,surgical history, family history and social history were all reviewed and documented in the EPIC chart.  Gynecologic History No LMP recorded. (Menstrual status: Oral contraceptives).  Obstetric History OB History  Gravida Para Term Preterm AB Living  2 2       2   SAB TAB Ectopic Multiple Live Births               # Outcome Date GA Lbr Len/2nd Weight Sex Delivery Anes PTL Lv  2 Para           1 Para              ROS: A ROS was performed and pertinent positives and negatives are included in the history.  GENERAL: No fevers or chills. HEENT: No change in vision, no earache, sore throat or sinus congestion. NECK: No pain or stiffness. CARDIOVASCULAR: No chest pain or pressure. No palpitations. PULMONARY: No shortness of breath, cough or wheeze. GASTROINTESTINAL: No abdominal pain, nausea, vomiting or diarrhea, melena or bright red blood per rectum. GENITOURINARY: No urinary frequency, urgency, hesitancy or dysuria. MUSCULOSKELETAL: No joint or muscle pain, no back pain, no recent trauma. DERMATOLOGIC: No rash, no itching, no lesions. ENDOCRINE: No polyuria, polydipsia, no heat or cold intolerance. No recent change in weight. HEMATOLOGICAL: No anemia or easy bruising or bleeding. NEUROLOGIC: No headache, seizures, numbness, tingling or weakness. PSYCHIATRIC: No depression, no loss of interest in normal activity or change in sleep pattern.     Exam:   BP 116/72   Ht 5' 4.25" (1.632 m)   Wt 115 lb (52.2 kg)    BMI 19.59 kg/m   Body mass index is 19.59 kg/m.  General appearance : Well developed well nourished female. No acute distress HEENT: Eyes: no retinal hemorrhage or exudates,  Neck supple, trachea midline, no carotid bruits, no thyroidmegaly Lungs: Clear to auscultation, no rhonchi or wheezes, or rib retractions  Heart: Regular rate and rhythm, no murmurs or gallops Breast:Examined in sitting and supine position were symmetrical in appearance, no palpable masses or tenderness,  no skin retraction, no nipple inversion, no nipple discharge, no skin discoloration, no axillary or supraclavicular lymphadenopathy Abdomen: no palpable masses or tenderness, no rebound or guarding Extremities: no edema or skin discoloration or tenderness  Pelvic: Vulva: Normal             Vagina: No gross lesions or discharge  Cervix: No gross lesions or discharge  Uterus  AV, normal size, shape and consistency, non-tender and mobile  Adnexa  Without masses or tenderness  Anus: Normal   Assessment/Plan:  49 y.o. female for annual exam   1. Well female exam with routine gynecological exam Normal gynecologic exam.  Pap test negative in 2020, no indication to repeat this year.  Breast exam normal.  Scheduled for screening mammogram this month July 2021.  Will schedule a screening colonoscopy through her family physician.  Good body mass index at 19.59.  Continue with fitness and healthy nutrition.  2. Encounter for surveillance of contraceptive pills Well on birth control pill with Junel FE 1/20.  No contraindication to continue.  Prescription sent to pharmacy.  Other orders - loratadine (CLARITIN) 10 MG tablet; Take 10 mg by mouth daily. - norethindrone-ethinyl estradiol (JUNEL FE 1/20) 1-20 MG-MCG tablet; Take 1 tablet by mouth daily.  Genia Del MD, 4:48 PM 12/04/2019

## 2019-12-19 ENCOUNTER — Ambulatory Visit
Admission: RE | Admit: 2019-12-19 | Discharge: 2019-12-19 | Disposition: A | Discharge status: Home / Self Care | Payer: 59 | Source: Ambulatory Visit | Attending: Obstetrics & Gynecology | Admitting: Obstetrics & Gynecology

## 2019-12-19 ENCOUNTER — Other Ambulatory Visit: Payer: Self-pay

## 2019-12-19 DIAGNOSIS — Z1231 Encounter for screening mammogram for malignant neoplasm of breast: Secondary | ICD-10-CM

## 2020-04-02 LAB — EXTERNAL GENERIC LAB PROCEDURE: COLOGUARD: NEGATIVE

## 2020-12-04 ENCOUNTER — Ambulatory Visit (INDEPENDENT_AMBULATORY_CARE_PROVIDER_SITE_OTHER): Payer: 59 | Admitting: Obstetrics & Gynecology

## 2020-12-04 ENCOUNTER — Other Ambulatory Visit: Payer: Self-pay

## 2020-12-04 ENCOUNTER — Encounter: Payer: Self-pay | Admitting: Obstetrics & Gynecology

## 2020-12-04 ENCOUNTER — Other Ambulatory Visit (HOSPITAL_COMMUNITY)
Admission: RE | Admit: 2020-12-04 | Discharge: 2020-12-04 | Disposition: A | Discharge status: Home / Self Care | Payer: 59 | Source: Ambulatory Visit | Attending: Obstetrics & Gynecology | Admitting: Obstetrics & Gynecology

## 2020-12-04 VITALS — BP 100/74 | HR 78 | Resp 16 | Ht 64.25 in | Wt 117.0 lb

## 2020-12-04 DIAGNOSIS — Z01419 Encounter for gynecological examination (general) (routine) without abnormal findings: Secondary | ICD-10-CM | POA: Insufficient documentation

## 2020-12-04 DIAGNOSIS — Z13820 Encounter for screening for osteoporosis: Secondary | ICD-10-CM

## 2020-12-04 DIAGNOSIS — Z3041 Encounter for surveillance of contraceptive pills: Secondary | ICD-10-CM

## 2020-12-04 MED ORDER — NORETHIN ACE-ETH ESTRAD-FE 1-20 MG-MCG PO TABS: 1.0000 | ORAL_TABLET | Freq: Every day | ORAL | 4 refills | Status: DC

## 2020-12-04 NOTE — Progress Notes (Signed)
Kristi [name] 24-May-1971 [id number]   History:    50 y.o. . G2P2L2 Married.  2 daughters 6 and 63 yo.   RP:  Established patient presenting for annual gyn exam   HPI: Well on Junel 1/20 continuous use.  Rare light menses.  No pelvic pain.  Abstinent.  Normal vaginal secretions.  Abstinent.  Not a very good relationship with husband, but no abuse.  Urine/BMs normal.  Breasts normal.  Strong fam H/O Osteoporosis.  BMI 19.93.  Good fitness.  Healthy nutrition.  Health Labs with Fam MD.   Past medical history,surgical history, family history and social history were all reviewed and documented in the EPIC chart.  Gynecologic History No LMP recorded. (Menstrual status: Oral contraceptives).  Obstetric History OB History  Gravida Para Term Preterm AB Living  2 2       2   SAB IAB Ectopic Multiple Live Births               # Outcome Date GA Lbr Len/2nd Weight Sex Delivery Anes PTL Lv  2 Para           1 Para              ROS: A ROS was performed and pertinent positives and negatives are included in the history.  GENERAL: No fevers or chills. HEENT: No change in vision, no earache, sore throat or sinus congestion. NECK: No pain or stiffness. CARDIOVASCULAR: No chest pain or pressure. No palpitations. PULMONARY: No shortness of breath, cough or wheeze. GASTROINTESTINAL: No abdominal pain, nausea, vomiting or diarrhea, melena or bright red blood per rectum. GENITOURINARY: No urinary frequency, urgency, hesitancy or dysuria. MUSCULOSKELETAL: No joint or muscle pain, no back pain, no recent trauma. DERMATOLOGIC: No rash, no itching, no lesions. ENDOCRINE: No polyuria, polydipsia, no heat or cold intolerance. No recent change in weight. HEMATOLOGICAL: No anemia or easy bruising or bleeding. NEUROLOGIC: No headache, seizures, numbness, tingling or weakness. PSYCHIATRIC: No depression, no loss of interest in normal activity or change in sleep pattern.     Exam:   BP 100/74   Pulse 78   Resp  16   Ht 5' 4.25" (1.632 m)   Wt 117 lb (53.1 kg)   BMI 19.93 kg/m   Body mass index is 19.93 kg/m.  General appearance : Well developed well nourished female. No acute distress HEENT: Eyes: no retinal hemorrhage or exudates,  Neck supple, trachea midline, no carotid bruits, no thyroidmegaly Lungs: Clear to auscultation, no rhonchi or wheezes, or rib retractions  Heart: Regular rate and rhythm, no murmurs or gallops Breast:Examined in sitting and supine position were symmetrical in appearance, no palpable masses or tenderness,  no skin retraction, no nipple inversion, no nipple discharge, no skin discoloration, no axillary or supraclavicular lymphadenopathy Abdomen: no palpable masses or tenderness, no rebound or guarding Extremities: no edema or skin discoloration or tenderness  Pelvic: Vulva: Normal             Vagina: No gross lesions or discharge  Cervix: No gross lesions or discharge.  Pap reflex done.  Uterus  AV, normal size, shape and consistency, non-tender and mobile  Adnexa  Without masses or tenderness  Anus: Normal   Assessment/Plan:  50 y.o. female for annual exam   1. Encounter for routine gynecological examination with Papanicolaou smear of cervix Normal gynecologic exam.  Pap reflex done.  Breast exam normal.  Screening mammogram July 2021 was negative, will schedule now for this year.  Cologuard negative in October 2021.  Body mass index 19.93.  Good fitness and nutrition.  Health labs with family nurse practitioner. - Cytology - PAP( [place])  2. Encounter for surveillance of contraceptive pills Well on Junel Fe 1/20.  No contraindication to continue.  Prescription sent to pharmacy.  3. Screening for osteoporosis Strong family history of osteoporosis.  Patient is a very thin Caucasian.  Probably in perimenopause.  Decision to proceed with a bone density now.  We will schedule at the breast center.  Recommend vitamin D supplements, will check a vitamin D level  with her family nurse practitioner.  Calcium intake of 1.5 g/day total.  Regular weightbearing physical activities recommended. - DG Bone Density; Future  Other orders - desloratadine (CLARINEX) 5 MG tablet; Take by mouth. - fexofenadine (ALLEGRA) 180 MG tablet; Take by mouth. - ACIDOPHILUS LACTOBACILLUS PO; Take by mouth. - Biotin 5 MG TABS; Take by mouth. - Cholecalciferol (VITAMIN D3 PO); Take by mouth. 1000 - Multiple Minerals-Vitamins (CALCIUM-MAGNESIUM-ZINC-D3 PO); Take by mouth. - Ascorbic Acid (VITAMIN C PO); Take 5,000 mg by mouth. - norethindrone-ethinyl estradiol-FE (JUNEL FE 1/20) 1-20 MG-MCG tablet; Take 1 tablet by mouth daily.   Genia Del MD, 4:09 PM 12/04/2020

## 2020-12-05 ENCOUNTER — Encounter: Payer: Self-pay | Admitting: Obstetrics & Gynecology

## 2020-12-09 ENCOUNTER — Telehealth: Payer: Self-pay

## 2020-12-09 NOTE — Telephone Encounter (Signed)
Patient would like to do her bone density screening test at the [facility] because she could do her mammogram there as well.

## 2020-12-10 ENCOUNTER — Other Ambulatory Visit: Payer: Self-pay

## 2020-12-10 DIAGNOSIS — Z13820 Encounter for screening for osteoporosis: Secondary | ICD-10-CM

## 2020-12-10 LAB — CYTOLOGY - PAP
Comment: NEGATIVE
Diagnosis: NEGATIVE
High risk HPV: NEGATIVE

## 2020-12-10 NOTE — Telephone Encounter (Signed)
Left message for patient that order placed and she call and schedule when ready.

## 2020-12-16 ENCOUNTER — Other Ambulatory Visit: Payer: Self-pay | Admitting: Obstetrics & Gynecology

## 2020-12-16 DIAGNOSIS — Z1231 Encounter for screening mammogram for malignant neoplasm of breast: Secondary | ICD-10-CM

## 2020-12-19 ENCOUNTER — Ambulatory Visit
Admission: RE | Admit: 2020-12-19 | Discharge: 2020-12-19 | Disposition: A | Discharge status: Home / Self Care | Payer: 59 | Source: Ambulatory Visit | Attending: Obstetrics & Gynecology | Admitting: Obstetrics & Gynecology

## 2020-12-19 ENCOUNTER — Other Ambulatory Visit: Payer: Self-pay

## 2020-12-19 DIAGNOSIS — Z1231 Encounter for screening mammogram for malignant neoplasm of breast: Secondary | ICD-10-CM

## 2021-01-03 IMAGING — MG DIGITAL SCREENING BILATERAL MAMMOGRAM WITH CAD
4 series · 4 of 4 positions shown · non-contrast
Comparison: Previous exam(s).

CLINICAL DATA: Screening.

EXAM:
DIGITAL SCREENING BILATERAL MAMMOGRAM WITH CAD

[R MLO]
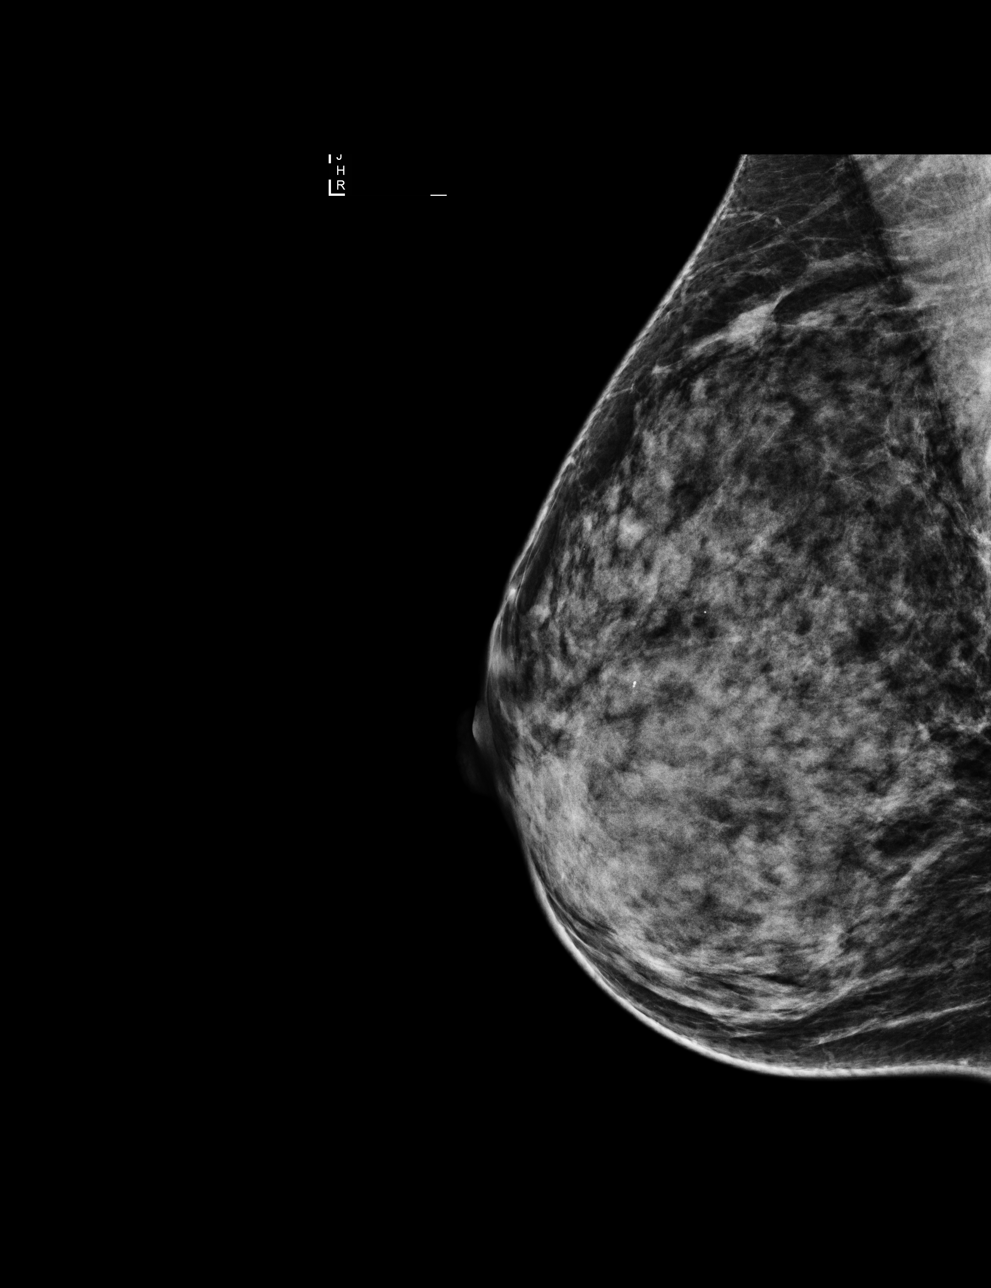

[R CC]
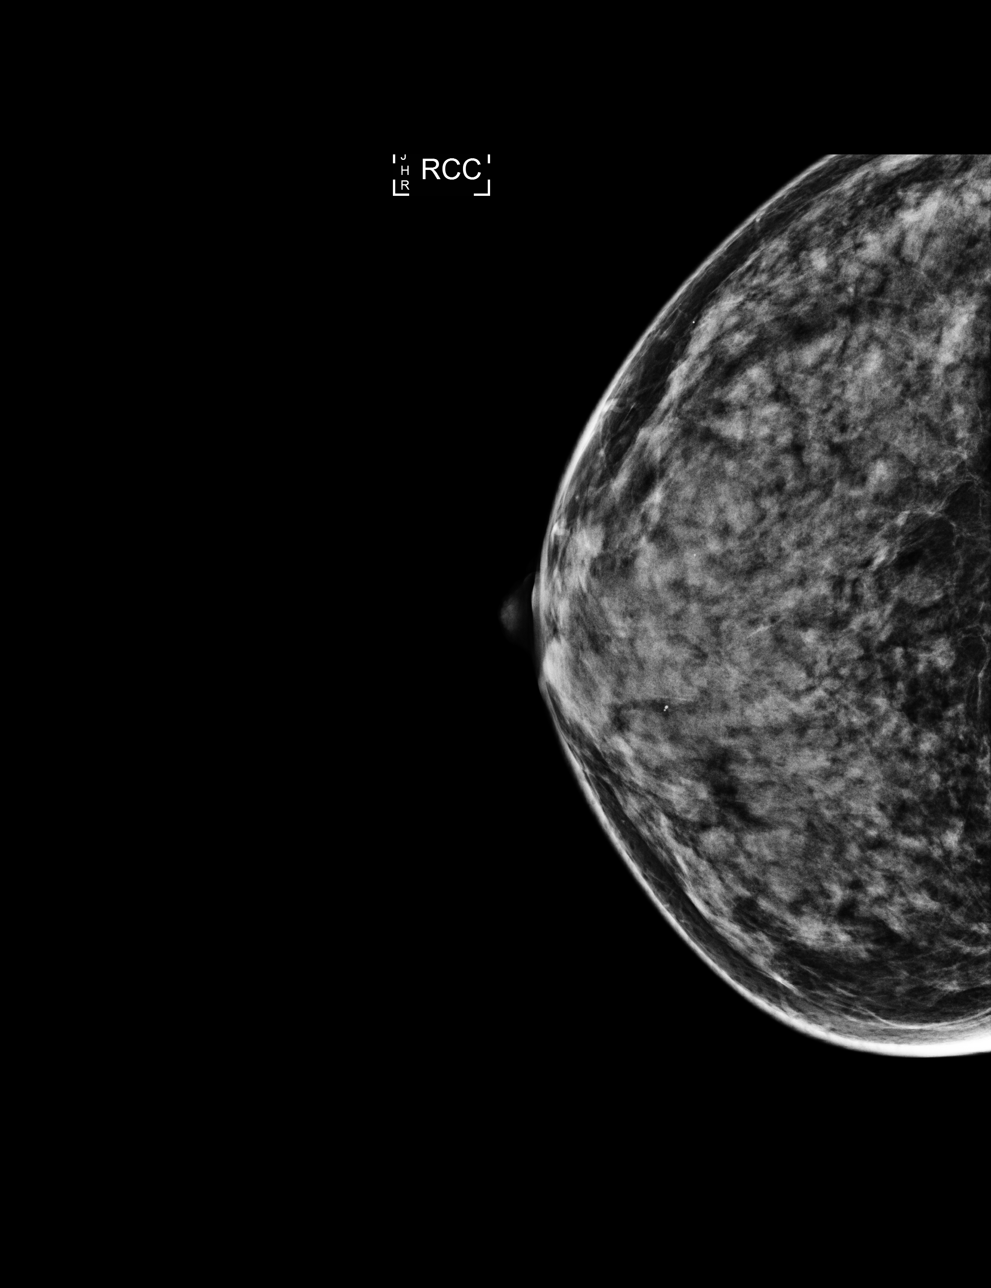

[L MLO]
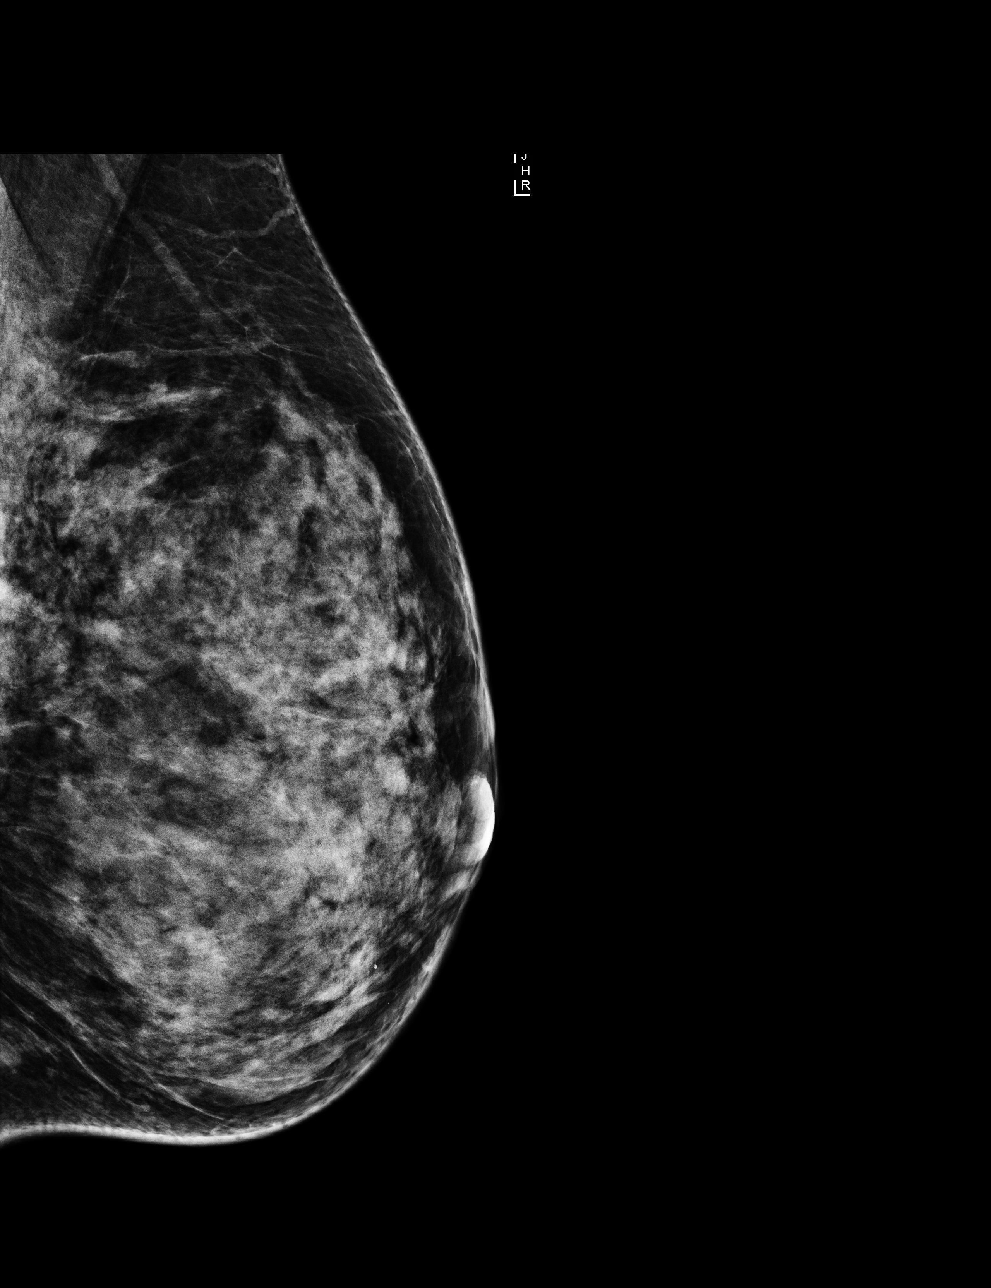

[L CC]
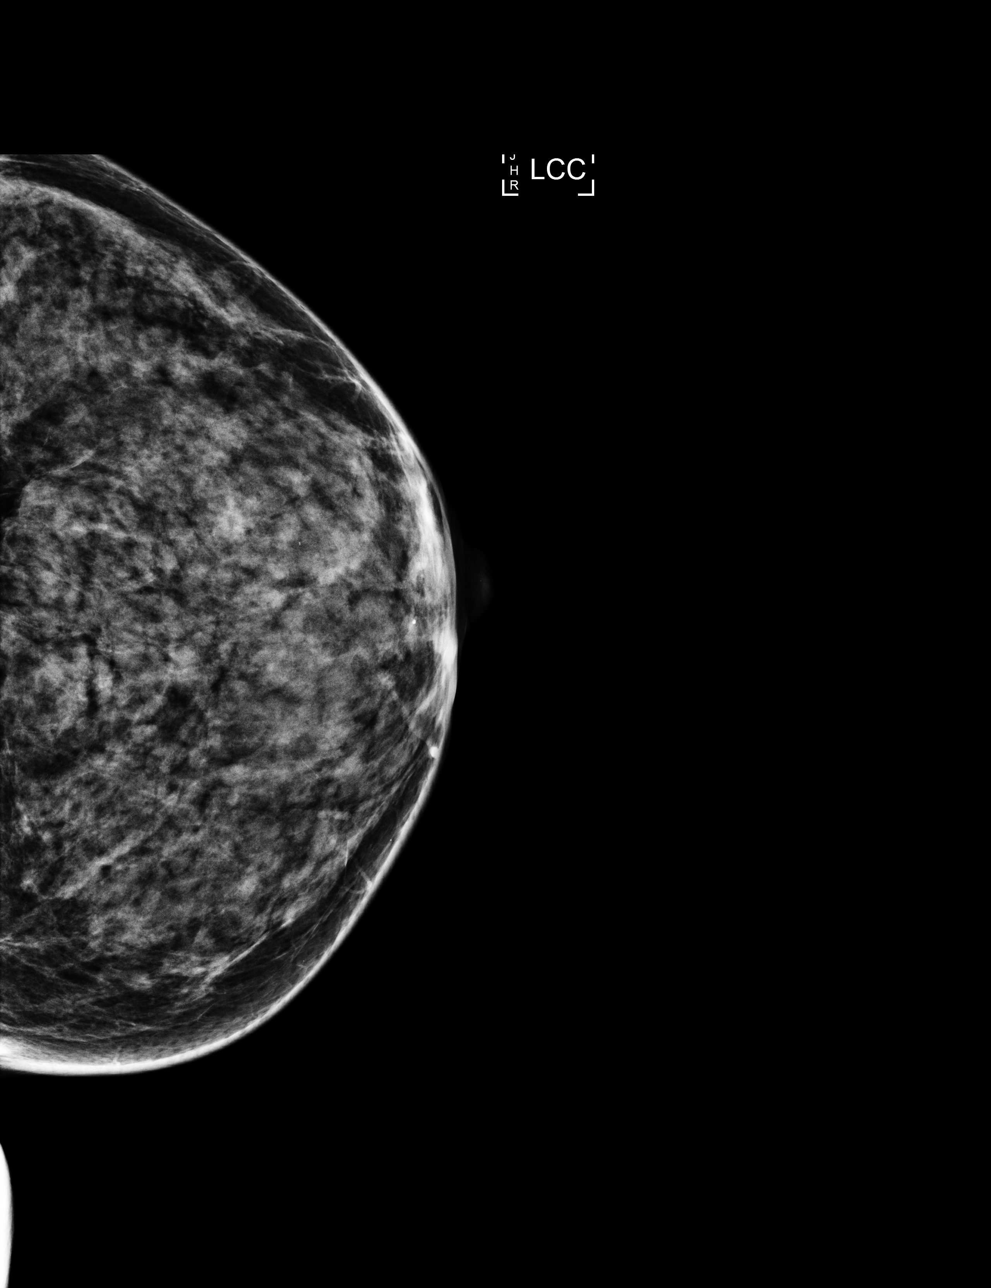

[4 of 4 positions shown; findings below may reference images not displayed]

ACR Breast Density Category c: The breast tissue is heterogeneously
dense, which may obscure small masses.
FINDINGS: There are no findings suspicious for malignancy. Images were
processed with CAD.
IMPRESSION: No mammographic evidence of malignancy. A result letter of this
screening mammogram will be mailed directly to the patient.

RECOMMENDATION:
Screening mammogram in one year. (Code:YJ-2-FEZ)

BI-RADS CATEGORY  1: Negative.

## 2021-01-20 ENCOUNTER — Other Ambulatory Visit: Payer: Self-pay

## 2021-01-20 ENCOUNTER — Ambulatory Visit (INDEPENDENT_AMBULATORY_CARE_PROVIDER_SITE_OTHER): Payer: 59

## 2021-01-20 ENCOUNTER — Other Ambulatory Visit: Payer: Self-pay | Admitting: Obstetrics & Gynecology

## 2021-01-20 DIAGNOSIS — M8589 Other specified disorders of bone density and structure, multiple sites: Secondary | ICD-10-CM

## 2021-01-20 DIAGNOSIS — Z13820 Encounter for screening for osteoporosis: Secondary | ICD-10-CM

## 2021-11-27 ENCOUNTER — Other Ambulatory Visit: Payer: Self-pay | Admitting: Obstetrics & Gynecology

## 2021-11-27 DIAGNOSIS — Z1231 Encounter for screening mammogram for malignant neoplasm of breast: Secondary | ICD-10-CM

## 2021-12-09 ENCOUNTER — Ambulatory Visit: Payer: 59 | Admitting: Obstetrics & Gynecology

## 2021-12-23 ENCOUNTER — Ambulatory Visit
Admission: RE | Admit: 2021-12-23 | Discharge: 2021-12-23 | Disposition: A | Discharge status: Home / Self Care | Payer: Managed Care, Other (non HMO) | Source: Ambulatory Visit | Attending: Obstetrics & Gynecology | Admitting: Obstetrics & Gynecology

## 2021-12-23 DIAGNOSIS — Z1231 Encounter for screening mammogram for malignant neoplasm of breast: Secondary | ICD-10-CM

## 2022-01-13 ENCOUNTER — Ambulatory Visit (INDEPENDENT_AMBULATORY_CARE_PROVIDER_SITE_OTHER): Payer: Managed Care, Other (non HMO) | Admitting: Obstetrics & Gynecology

## 2022-01-13 ENCOUNTER — Encounter: Payer: Self-pay | Admitting: Obstetrics & Gynecology

## 2022-01-13 VITALS — BP 130/80 | Ht 64.0 in | Wt 114.0 lb

## 2022-01-13 DIAGNOSIS — Z01419 Encounter for gynecological examination (general) (routine) without abnormal findings: Secondary | ICD-10-CM

## 2022-01-13 DIAGNOSIS — Z3041 Encounter for surveillance of contraceptive pills: Secondary | ICD-10-CM

## 2022-01-13 DIAGNOSIS — M85852 Other specified disorders of bone density and structure, left thigh: Secondary | ICD-10-CM

## 2022-01-13 MED ORDER — NORETHIN ACE-ETH ESTRAD-FE 1-20 MG-MCG PO TABS: 1.0000 | ORAL_TABLET | Freq: Every day | ORAL | 4 refills | Status: DC

## 2022-01-13 NOTE — Progress Notes (Signed)
Kristi [name] 1970/12/12 [id number]   History:    51 y.o. G2P2L2 Married.  2 daughters 38 and 34 yo.   RP:  Established patient presenting for annual gyn exam   HPI: Well on Junel 1/20 continuous use.  Rare light menses.  No pelvic pain.  Abstinent.  Normal vaginal secretions.  Abstinent. Pap Neg 11/2020.  No h/o abnormal Pap.  Will repeat Pap at 3 years. Not a very good relationship with husband, but no abuse. Urine/BMs normal.  Cologuard 2 yrs ago.  Breasts normal.  Mammo Neg 11/2021.  Strong fam H/O Osteoporosis.  BD Osteopenia 02/2021 with T-Score -1.9 at Lt Fem Neck.  BMI 19.57.  Good fitness.  Healthy nutrition, but needs to increase total calories.  Health Labs with Fam MD.      Past medical history,surgical history, family history and social history were all reviewed and documented in the EPIC chart.  Gynecologic History Patient's last menstrual period was 12/24/2021.  Obstetric History OB History  Gravida Para Term Preterm AB Living  2 2       2   SAB IAB Ectopic Multiple Live Births               # Outcome Date GA Lbr Len/2nd Weight Sex Delivery Anes PTL Lv  2 Para           1 Para              ROS: A ROS was performed and pertinent positives and negatives are included in the history.  GENERAL: No fevers or chills. HEENT: No change in vision, no earache, sore throat or sinus congestion. NECK: No pain or stiffness. CARDIOVASCULAR: No chest pain or pressure. No palpitations. PULMONARY: No shortness of breath, cough or wheeze. GASTROINTESTINAL: No abdominal pain, nausea, vomiting or diarrhea, melena or bright red blood per rectum. GENITOURINARY: No urinary frequency, urgency, hesitancy or dysuria. MUSCULOSKELETAL: No joint or muscle pain, no back pain, no recent trauma. DERMATOLOGIC: No rash, no itching, no lesions. ENDOCRINE: No polyuria, polydipsia, no heat or cold intolerance. No recent change in weight. HEMATOLOGICAL: No anemia or easy bruising or bleeding. NEUROLOGIC: No  headache, seizures, numbness, tingling or weakness. PSYCHIATRIC: No depression, no loss of interest in normal activity or change in sleep pattern.     Exam:   BP 130/80 (BP Location: Right Arm, Patient Position: Sitting, Cuff Size: Normal)   Ht 5\' 4"  (1.626 m)   Wt 114 lb (51.7 kg)   LMP 12/24/2021   BMI 19.57 kg/m   Body mass index is 19.57 kg/m.  General appearance : Well developed well nourished female. No acute distress HEENT: Eyes: no retinal hemorrhage or exudates,  Neck supple, trachea midline, no carotid bruits, no thyroidmegaly Lungs: Clear to auscultation, no rhonchi or wheezes, or rib retractions  Heart: Regular rate and rhythm, no murmurs or gallops Breast:Examined in sitting and supine position were symmetrical in appearance, no palpable masses or tenderness,  no skin retraction, no nipple inversion, no nipple discharge, no skin discoloration, no axillary or supraclavicular lymphadenopathy Abdomen: no palpable masses or tenderness, no rebound or guarding Extremities: no edema or skin discoloration or tenderness  Pelvic: Vulva: Normal             Vagina: No gross lesions or discharge  Cervix: No gross lesions or discharge  Uterus  AV, normal size, shape and consistency, non-tender and mobile  Adnexa  Without masses or tenderness  Anus: Normal   Assessment/Plan:  50  y.o. female for annual exam   1. Well female exam with routine gynecological exam Well on Junel 1/20 continuous use.  Rare light menses.  No pelvic pain.  Normal vaginal secretions.  Abstinent. Pap Neg 11/2020.  No h/o abnormal Pap.  Will repeat Pap at 3 years. Not a very good relationship with husband, but no abuse. Urine/BMs normal.  Cologuard 2 yrs ago.  Breasts normal.  Mammo Neg 11/2021.  Strong fam H/O Osteoporosis.  BD Osteopenia 02/2021 with T-Score -1.9 at Lt Fem Neck.  BMI 19.57.  Good fitness.  Healthy nutrition, but needs to increase total calories.  Health Labs with Fam MD.     2. Encounter for  surveillance of contraceptive pills Well on Junel 1/20 continuous use.  Rare light menses.  No pelvic pain.  Abstinent.  No CI to BCPs.  Prescription sent to pharmacy.  3. Osteopenia of neck of left femur Strong fam H/O Osteoporosis.  BD Osteopenia 02/2021 with T-Score -1.9 at Lt Fem Neck.  BMI 19.57.  Good fitness.  Healthy nutrition, but needs to increase total calories. Vit D, Ca++ 1.5 g/d total.  Will repeat BD in 03/2023.   Other orders - norethindrone-ethinyl estradiol-FE (JUNEL FE 1/20) 1-20 MG-MCG tablet; Take 1 tablet by mouth daily.   Genia Del MD, 3:35 PM 01/13/2022

## 2022-11-25 LAB — COLOGUARD: COLOGUARD: NEGATIVE

## 2022-11-25 LAB — EXTERNAL GENERIC LAB PROCEDURE: COLOGUARD: NEGATIVE

## 2022-12-13 ENCOUNTER — Other Ambulatory Visit: Payer: Self-pay | Admitting: Obstetrics & Gynecology

## 2022-12-13 DIAGNOSIS — Z1231 Encounter for screening mammogram for malignant neoplasm of breast: Secondary | ICD-10-CM

## 2022-12-27 ENCOUNTER — Ambulatory Visit: Payer: Managed Care, Other (non HMO)

## 2022-12-30 ENCOUNTER — Ambulatory Visit
Admission: RE | Admit: 2022-12-30 | Discharge: 2022-12-30 | Disposition: A | Discharge status: Home / Self Care | Payer: Managed Care, Other (non HMO) | Source: Ambulatory Visit | Attending: Obstetrics & Gynecology | Admitting: Obstetrics & Gynecology

## 2022-12-30 DIAGNOSIS — Z1231 Encounter for screening mammogram for malignant neoplasm of breast: Secondary | ICD-10-CM

## 2023-01-21 ENCOUNTER — Ambulatory Visit: Payer: Managed Care, Other (non HMO) | Admitting: Obstetrics & Gynecology

## 2023-02-21 ENCOUNTER — Encounter: Payer: Self-pay | Admitting: Obstetrics and Gynecology

## 2023-02-21 ENCOUNTER — Ambulatory Visit (INDEPENDENT_AMBULATORY_CARE_PROVIDER_SITE_OTHER): Payer: Managed Care, Other (non HMO) | Admitting: Obstetrics and Gynecology

## 2023-02-21 ENCOUNTER — Other Ambulatory Visit (HOSPITAL_COMMUNITY)
Admission: RE | Admit: 2023-02-21 | Discharge: 2023-02-21 | Disposition: A | Discharge status: Home / Self Care | Payer: Managed Care, Other (non HMO) | Source: Ambulatory Visit | Attending: Obstetrics and Gynecology | Admitting: Obstetrics and Gynecology

## 2023-02-21 VITALS — BP 110/64 | HR 87 | Ht 64.25 in | Wt 118.0 lb

## 2023-02-21 DIAGNOSIS — Z01419 Encounter for gynecological examination (general) (routine) without abnormal findings: Secondary | ICD-10-CM | POA: Diagnosis present

## 2023-02-21 DIAGNOSIS — M8580 Other specified disorders of bone density and structure, unspecified site: Secondary | ICD-10-CM | POA: Diagnosis not present

## 2023-02-21 NOTE — Patient Instructions (Signed)
Stop ocp's now Draw estrogen level and FSH in 2 weeks Meet Dr. Edward Jolly for HRT initiation consult in 3 weeks Bone scan in 3 weeks  Buy wrist and ankle weights to wear on the tread mill for bone support

## 2023-02-21 NOTE — Progress Notes (Signed)
52 y.o. y.o. female here for annual exam. She denies any bleeding.   She is currently on ocp's  No LMP recorded. (Menstrual status: Oral contraceptives).   Dexa 2022 with osteopenia of left femural neck.  Strong family history of osteoporosis   Blood pressure 110/64, pulse 87, height 5' 4.25" (1.632 m), weight 118 lb (53.5 kg), SpO2 99%.     Component Value Date/Time   DIAGPAP  12/04/2020 1627    - Negative for intraepithelial lesion or malignancy (NILM)   HPVHIGH Negative 12/04/2020 1627   ADEQPAP  12/04/2020 1627    Satisfactory for evaluation; transformation zone component PRESENT.    GYN HISTORY:    Component Value Date/Time   DIAGPAP  12/04/2020 1627    - Negative for intraepithelial lesion or malignancy (NILM)   HPVHIGH Negative 12/04/2020 1627   ADEQPAP  12/04/2020 1627    Satisfactory for evaluation; transformation zone component PRESENT.    OB History  Gravida Para Term Preterm AB Living  2 2       2   SAB IAB Ectopic Multiple Live Births               # Outcome Date GA Lbr Len/2nd Weight Sex Type Anes PTL Lv  2 Para           1 Para             History reviewed. No pertinent past medical history.  Past Surgical History:  Procedure Laterality Date   peridontal surgery     peridontal surgery      Current Outpatient Medications on File Prior to Visit  Medication Sig Dispense Refill   ACIDOPHILUS LACTOBACILLUS PO Take by mouth.     Ascorbic Acid (VITAMIN C PO) Take 1,000 mg by mouth. With mag,zinc     Biotin 5 MG TABS Take by mouth.     Cholecalciferol (VITAMIN D3 PO) Take by mouth.     desloratadine (CLARINEX) 5 MG tablet Take by mouth.     fexofenadine (ALLEGRA) 180 MG tablet Take by mouth.     Multiple Minerals-Vitamins (CALCIUM-MAGNESIUM-ZINC-D3 PO) Take by mouth.     Multiple Vitamin (MULTIVITAMIN) tablet Take 1 tablet by mouth daily.     norethindrone-ethinyl estradiol-FE (JUNEL FE 1/20) 1-20 MG-MCG tablet Take 1 tablet by mouth daily. 84  tablet 4   No current facility-administered medications on file prior to visit.    Social History   Socioeconomic History   Marital status: Married    Spouse name: Not on file   Number of children: Not on file   Years of education: Not on file   Highest education level: Not on file  Occupational History   Not on file  Tobacco Use   Smoking status: Never   Smokeless tobacco: Never  Vaping Use   Vaping status: Never Used  Substance and Sexual Activity   Alcohol use: Never   Drug use: Never   Sexual activity: Not Currently    Partners: Male    Birth control/protection: OCP    Comment: 1st intercourse- 20's, partners- 2, married- 21 yrs   Other Topics Concern   Not on file  Social History Narrative   Not on file   Social Determinants of Health   Financial Resource Strain: Low Risk  (03/24/2021)   Received from [employer], [facility]   Overall Financial Resource Strain (CARDIA)    Difficulty of Paying Living Expenses: Not hard at all  Food Insecurity: No Food Insecurity (  04/01/2022)   Received from [facility]   Hunger Vital Sign    Worried About Running Out of Food in the Last Year: Never true    Ran Out of Food in the Last Year: Never true  Transportation Needs: No Transportation Needs (03/24/2021)   Received from [facility], [facility]   [facility] - Transportation    Lack of Transportation (Medical): No    Lack of Transportation (Non-Medical): No  Physical Activity: Sufficiently Active (03/24/2021)   Received from [facility], [facility]   Exercise Vital Sign    Days of Exercise per Week: 5 days    Minutes of Exercise per Session: 30 min  Stress: Unknown (03/24/2021)   Received from [facility] - Occupational Stress Questionnaire    Feeling of Stress : Patient declined  Social Connections: Unknown (10/12/2021)   Received from [facility], [facility]   Social Network    Social  Network: Not on file  Intimate Partner Violence: Unknown (08/31/2021)   Received from [facility], [facility]   HITS    Physically Hurt: Not on file    Insult or Talk Down To: Not on file    Threaten Physical Harm: Not on file    Scream or Curse: Not on file    Family History  Problem Relation Age of Onset   Stroke Mother    Diabetes Mother    Hypertension Mother    Stroke Maternal Grandfather      Allergies  Allergen Reactions   Penicillins Rash      Patient's last menstrual period was No LMP recorded. (Menstrual status: Oral contraceptives)..          Exercising: treadmill   Review of Systems Alls systems reviewed and are negative.     PE General appearance: alert, cooperative and appears stated age Head: Normocephalic, without obvious abnormality, atraumatic Neck: no adenopathy, supple, symmetrical, trachea midline and thyroid normal to inspection and palpation Lungs: clear to auscultation bilaterally Breasts: normal appearance, no masses or tenderness Heart: regular rate and rhythm Abdomen: soft, non-tender; bowel sounds normal; no masses,  no organomegaly Extremities: extremities normal, atraumatic, no cyanosis or edema Skin: Skin color, texture, turgor normal. No rashes or lesions Lymph nodes: Cervical, supraclavicular, and axillary nodes normal. No abnormal inguinal nodes palpated Neurologic: Grossly normal     Pelvic: External genitalia:  no lesions              Urethra:  normal appearing urethra with no masses, tenderness or lesions              Bartholins and Skenes: normal                 Vagina: normal appearing vagina with normal color and discharge, no lesions.               Cervix: no lesions, no cervical motion tenderness               Bimanual Exam:  Uterus:  normal size, contour, position, consistency, mobility, non-tender              Adnexa: no mass, fullness, tenderness          Chaperone was present for exam.   A:         Well Woman  GYN exam  P:        Pap smear collected             Encouraged annual mammogram screening             Colonoscopy: just completed cologuard this year             Labs and immunizations with her primary             Discussed breast self exams             Encouraged healthy lifestyle practices with diet and exercise Stop ocp's at this time.  Discussed if in menopause to change to HRT instead for bone support and this would be safer dose to lower risk of blood clots and stroke. Draw estrogen level and FSH in 2 weeks Meet Dr. Edward Jolly for HRT initiation consult in 3 weeks Bone scan in 3 weeks- changing insurance so will be able to complete by that time   Buy wrist and ankle weights to wear on the tread mill for bone support Continue vit D and calcium Earley Favor

## 2023-02-24 LAB — CYTOLOGY - PAP
Adequacy: ABSENT
Comment: NEGATIVE
Diagnosis: NEGATIVE
High risk HPV: NEGATIVE

## 2023-03-03 NOTE — Progress Notes (Deleted)
GYNECOLOGY  VISIT   HPI: 52 y.o.   Married  Caucasian  female   G2P2 with No LMP recorded. (Menstrual status: Oral contraceptives).   here for   hormone replacement  GYNECOLOGIC HISTORY: No LMP recorded. (Menstrual status: Oral contraceptives). Contraception:  OCP Menopausal hormone therapy:  n/a Last mammogram:  12/30/22 Breast Density Cat C, BI-RADS CAT  1 neg Last pap smear:   02/21/23 neg        OB History     Gravida  2   Para  2   Term      Preterm      AB      Living  2      SAB      IAB      Ectopic      Multiple      Live Births                 There are no problems to display for this patient.   No past medical history on file.  Past Surgical History:  Procedure Laterality Date   peridontal surgery     peridontal surgery      Current Outpatient Medications  Medication Sig Dispense Refill   ACIDOPHILUS LACTOBACILLUS PO Take by mouth.     Ascorbic Acid (VITAMIN C PO) Take 1,000 mg by mouth. With mag,zinc     Biotin 5 MG TABS Take by mouth.     Cholecalciferol (VITAMIN D3 PO) Take by mouth.     desloratadine (CLARINEX) 5 MG tablet Take by mouth.     fexofenadine (ALLEGRA) 180 MG tablet Take by mouth.     Multiple Minerals-Vitamins (CALCIUM-MAGNESIUM-ZINC-D3 PO) Take by mouth.     Multiple Vitamin (MULTIVITAMIN) tablet Take 1 tablet by mouth daily.     norethindrone-ethinyl estradiol-FE (JUNEL FE 1/20) 1-20 MG-MCG tablet Take 1 tablet by mouth daily. 84 tablet 4   No current facility-administered medications for this visit.     ALLERGIES: Penicillins  Family History  Problem Relation Age of Onset   Stroke Mother    Diabetes Mother    Hypertension Mother    Stroke Maternal Grandfather     Social History   Socioeconomic History   Marital status: Married    Spouse name: Not on file   Number of children: Not on file   Years of education: Not on file   Highest education level: Not on file  Occupational History   Not on file   Tobacco Use   Smoking status: Never   Smokeless tobacco: Never  Vaping Use   Vaping status: Never Used  Substance and Sexual Activity   Alcohol use: Never   Drug use: Never   Sexual activity: Not Currently    Partners: Male    Birth control/protection: OCP    Comment: 1st intercourse- 20's, partners- 2, married- 21 yrs   Other Topics Concern   Not on file  Social History Narrative   Not on file   Social Determinants of Health   Financial Resource Strain: Low Risk  (03/24/2021)   Received from [facility], [facility]   Overall Financial Resource Strain (CARDIA)    Difficulty of Paying Living Expenses: Not hard at all  Food Insecurity: No Food Insecurity (04/01/2022)   Received from [facility]   Hunger Vital Sign    Worried About Running Out of Food in the Last Year: Never true    Ran Out of Food in the Last Year: Never true  Transportation Needs: No Transportation Needs (03/24/2021)   Received from [facility], [facility]   [facility] - Transportation    Lack of Transportation (Medical): No    Lack of Transportation (Non-Medical): No  Physical Activity: Sufficiently Active (03/24/2021)   Received from [facility], [facility]   Exercise Vital Sign    Days of Exercise per Week: 5 days    Minutes of Exercise per Session: 30 min  Stress: Unknown (03/24/2021)   Received from [facility], [facility] - Occupational Stress Questionnaire    Feeling of Stress : Patient declined  Social Connections: Unknown (10/12/2021)   Received from [facility], [facility]   Social Network    Social Network: Not on file  Intimate Partner Violence: Unknown (08/31/2021)   Received from [facility], [facility]   HITS    Physically Hurt: Not on file    Insult or Talk Down To: Not on file    Threaten Physical Harm: Not on file    Scream or Curse: Not on file    Review of Systems  PHYSICAL EXAMINATION:    There were no  vitals taken for this visit.    General appearance: alert, cooperative and appears stated age Head: Normocephalic, without obvious abnormality, atraumatic Neck: no adenopathy, supple, symmetrical, trachea midline and thyroid normal to inspection and palpation Lungs: clear to auscultation bilaterally Breasts: normal appearance, no masses or tenderness, No nipple retraction or dimpling, No nipple discharge or bleeding, No axillary or supraclavicular adenopathy Heart: regular rate and rhythm Abdomen: soft, non-tender, no masses,  no organomegaly Extremities: extremities normal, atraumatic, no cyanosis or edema Skin: Skin color, texture, turgor normal. No rashes or lesions Lymph nodes: Cervical, supraclavicular, and axillary nodes normal. No abnormal inguinal nodes palpated Neurologic: Grossly normal  Pelvic: External genitalia:  no lesions              Urethra:  normal appearing urethra with no masses, tenderness or lesions              Bartholins and Skenes: normal                 Vagina: normal appearing vagina with normal color and discharge, no lesions              Cervix: no lesions                Bimanual Exam:  Uterus:  normal size, contour, position, consistency, mobility, non-tender              Adnexa: no mass, fullness, tenderness              Rectal exam: {yes no:314532}.  Confirms.              Anus:  normal sphincter tone, no lesions  Chaperone was present for exam:  ***  ASSESSMENT     PLAN     An After Visit Summary was printed and given to the patient.  ______ minutes face to face time of which over 50% was spent in counseling.

## 2023-03-07 ENCOUNTER — Other Ambulatory Visit: Payer: Managed Care, Other (non HMO)

## 2023-03-14 ENCOUNTER — Other Ambulatory Visit: Payer: Managed Care, Other (non HMO)

## 2023-03-14 DIAGNOSIS — Z01419 Encounter for gynecological examination (general) (routine) without abnormal findings: Secondary | ICD-10-CM

## 2023-03-15 LAB — FOLLICLE STIMULATING HORMONE: FSH: 141.7 m[IU]/mL — ABNORMAL HIGH

## 2023-03-15 LAB — ESTRADIOL: Estradiol: <15 pg/mL

## 2023-03-17 ENCOUNTER — Ambulatory Visit: Payer: Managed Care, Other (non HMO) | Admitting: Obstetrics and Gynecology

## 2023-03-20 ENCOUNTER — Other Ambulatory Visit: Payer: Self-pay | Admitting: Obstetrics & Gynecology

## 2023-03-21 NOTE — Telephone Encounter (Signed)
Medication refill request: Junel Fe  Last AEX:  02/21/23 Next AEX: 04/06/23 Last MMG (if hormonal medication request): n/a Refill authorized: please advise

## 2023-03-23 NOTE — Progress Notes (Signed)
GYNECOLOGY  VISIT   HPI: 52 y.o.   Married  Caucasian  female   G2P2 with No LMP recorded. (Menstrual status: Oral contraceptives).   here for   HRT consult.  Patient referred by Dr. Karma Greaser.   Has had rare menstrual periods while on combined oral contraceptives. Some night sweats for a while.    Came off pills for 3 weeks and had hormones checked on 03/14/23: FSH 141.7 and estradiol <15.   After stopping the pills, she is having hot flashes which are manageable.   Some joint pain.  Not sleeping as well at night.   Some stress and depression.  Job stress.  Works in HR.  Does not need contraception.   Concerned about her osteopenia.  Mother had osteoporosis.   Patient would like assistance in getting the BMD scheduled.  Exercises every day per week.   From [place].  GYNECOLOGIC HISTORY: No LMP recorded. (Menstrual status: Oral contraceptives). Contraception:  OCP Menopausal hormone therapy:  n/a Last mammogram:  12/30/22 Breast density Cat C, BI-RADS CAT 1 neg Last pap smear:   02/21/23 neg: HR HPV neg, 12/04/20 neg : HR HPV neg        OB History     Gravida  2   Para  2   Term      Preterm      AB      Living  2      SAB      IAB      Ectopic      Multiple      Live Births                 There are no problems to display for this patient.   History reviewed. No pertinent past medical history.  Past Surgical History:  Procedure Laterality Date   peridontal surgery     peridontal surgery      Current Outpatient Medications  Medication Sig Dispense Refill   ACIDOPHILUS LACTOBACILLUS PO Take by mouth.     Ascorbic Acid (VITAMIN C PO) Take 1,000 mg by mouth. With mag,zinc     Biotin 5 MG TABS Take by mouth.     Cholecalciferol (VITAMIN D3 PO) Take by mouth.     desloratadine (CLARINEX) 5 MG tablet Take by mouth.     fexofenadine (ALLEGRA) 180 MG tablet Take by mouth.     JUNEL FE 1/20 1-20 MG-MCG tablet TAKE 1 TABLET BY MOUTH EVERY DAY 84  tablet 4   Multiple Minerals-Vitamins (CALCIUM-MAGNESIUM-ZINC-D3 PO) Take by mouth.     Multiple Vitamin (MULTIVITAMIN) tablet Take 1 tablet by mouth daily.     No current facility-administered medications for this visit.     ALLERGIES: Penicillins  Family History  Problem Relation Age of Onset   Stroke Mother    Diabetes Mother    Hypertension Mother    Stroke Maternal Grandfather     Social History   Socioeconomic History   Marital status: Married    Spouse name: Not on file   Number of children: Not on file   Years of education: Not on file   Highest education level: Not on file  Occupational History   Not on file  Tobacco Use   Smoking status: Never   Smokeless tobacco: Never  Vaping Use   Vaping status: Never Used  Substance and Sexual Activity   Alcohol use: Never   Drug use: Never   Sexual activity: Not Currently  Partners: Male    Birth control/protection: OCP    Comment: 1st intercourse- 20's, partners- 2, married- 21 yrs   Other Topics Concern   Not on file  Social History Narrative   Not on file   Social Determinants of Health   Financial Resource Strain: Low Risk  (04/04/2023)   Received from [facility]   Overall Financial Resource Strain (CARDIA)    Difficulty of Paying Living Expenses: Not hard at all  Food Insecurity: No Food Insecurity (04/04/2023)   Received from [facility]   Hunger Vital Sign    Worried About Running Out of Food in the Last Year: Never true    Ran Out of Food in the Last Year: Never true  Transportation Needs: No Transportation Needs (04/04/2023)   Received from [facility] - Transportation    Lack of Transportation (Medical): No    Lack of Transportation (Non-Medical): No  Physical Activity: Sufficiently Active (04/04/2023)   Received from [facility]   Exercise Vital Sign    Days of Exercise per Week: 7 days    Minutes of Exercise per Session: 30 min  Stress: No Stress Concern Present (04/04/2023)    Received from [facility] - Occupational Stress Questionnaire    Feeling of Stress : Only a little  Social Connections: Moderately Integrated (04/04/2023)   Received from [facility]   Social Network    How would you rate your social network (family, work, friends)?: Adequate participation with social networks  Intimate Partner Violence: Not At Risk (04/04/2023)   Received from [facility]   HITS    Over the last 12 months how often did your partner physically hurt you?: 1    Over the last 12 months how often did your partner insult you or talk down to you?: 1    Over the last 12 months how often did your partner threaten you with physical harm?: 1    Over the last 12 months how often did your partner scream or curse at you?: 1    Review of Systems  All other systems reviewed and are negative.   PHYSICAL EXAMINATION:    BP 102/64 (BP Location: Left Arm, Patient Position: Sitting, Cuff Size: Normal)   Pulse 72   Ht 5' 4.25" (1.632 m)   Wt 118 lb (53.5 kg)   SpO2 99%   BMI 20.10 kg/m     General appearance: alert, cooperative and appears stated age  ASSESSMENT  Menopausal symptoms.  Hormone levels consistent with postmenopausal status.   PLAN  We discussed physiologic changes with menopause and effect on reproductive function, bone and joint health, cardiovascular disease, and quality and wellbeing in daily functioning.  Options for care reviewed:  no intervention, HRT, SSRI/SNRI, Gabapentin.  We focused on HRT in detail and discussed routes of administration and risks and benefits.  Risks may include stroke, DVT, PE, MI, and breast cancer.  Will start Vivelle Dot 0.05 mg transdermal patch twice weekly and Prometrium 100 mg q hs.  Will have office assist with scheduling BMD.  Fu for HRT recheck appointment in 2 - 3 months.   40 min  total time was spent for this patient encounter, including preparation, face-to-face  counseling with the patient, coordination of care, and documentation of the encounter.

## 2023-04-06 ENCOUNTER — Ambulatory Visit (INDEPENDENT_AMBULATORY_CARE_PROVIDER_SITE_OTHER): Payer: Managed Care, Other (non HMO) | Admitting: Obstetrics and Gynecology

## 2023-04-06 ENCOUNTER — Encounter: Payer: Self-pay | Admitting: Obstetrics and Gynecology

## 2023-04-06 ENCOUNTER — Telehealth: Payer: Self-pay | Admitting: Obstetrics and Gynecology

## 2023-04-06 VITALS — BP 102/64 | HR 72 | Ht 64.25 in | Wt 118.0 lb

## 2023-04-06 DIAGNOSIS — Z01419 Encounter for gynecological examination (general) (routine) without abnormal findings: Secondary | ICD-10-CM

## 2023-04-06 DIAGNOSIS — N951 Menopausal and female climacteric states: Secondary | ICD-10-CM | POA: Diagnosis not present

## 2023-04-06 DIAGNOSIS — M8580 Other specified disorders of bone density and structure, unspecified site: Secondary | ICD-10-CM

## 2023-04-06 MED ORDER — ESTRADIOL 0.05 MG/24HR TD PTTW: 1.0000 | MEDICATED_PATCH | TRANSDERMAL | 0 refills | Status: DC

## 2023-04-06 MED ORDER — PROGESTERONE MICRONIZED 100 MG PO CAPS: ORAL_CAPSULE | ORAL | 0 refills | Status: DC

## 2023-04-06 NOTE — Patient Instructions (Signed)

## 2023-04-06 NOTE — Telephone Encounter (Signed)
Please assist in scheduling bone density for Dr. Bonney Roussel patient.  She has osteopenia.  [facility] location.   I saw patient today for another reason.

## 2023-04-11 ENCOUNTER — Other Ambulatory Visit: Payer: Self-pay | Admitting: Obstetrics and Gynecology

## 2023-04-11 ENCOUNTER — Encounter: Payer: Self-pay | Admitting: Obstetrics and Gynecology

## 2023-04-11 MED ORDER — PROGESTERONE MICRONIZED 100 MG PO CAPS: ORAL_CAPSULE | ORAL | 0 refills | Status: DC

## 2023-04-11 NOTE — Progress Notes (Signed)
Corrected prescription for Prometrium 100 mg q hs sent to [facility] on [address], RF none.

## 2023-04-11 NOTE — Telephone Encounter (Signed)
DEXA order location modified/changed to [facility] location.   Will notify pt via mychart msg to contact [facility] to schedule.

## 2023-04-11 NOTE — Telephone Encounter (Signed)
Spoke w/ Kristi [name] at [facility] and they just need clarification on rx for progesterone rx since rx stated prometrium 100mg  capsule but sig stated "Take one capsule (200mg ) at bedtime.   Per OV notes from visit w/ BS 04/06/2023: "Will start Vivelle Dot 0.05 mg transdermal patch twice weekly and Prometrium 100 mg q hs."  Kristi [name] advised that the pt is to take one 100mg  caps at bedtime.   She voiced understanding and appreciation for call & clarification on script.   Pt notified that this has been handled/corrected via mychart msg.  Routing to provider for final review and closing encounter.

## 2023-04-19 ENCOUNTER — Ambulatory Visit (HOSPITAL_BASED_OUTPATIENT_CLINIC_OR_DEPARTMENT_OTHER)
Admission: RE | Admit: 2023-04-19 | Discharge: 2023-04-19 | Disposition: A | Discharge status: Home / Self Care | Payer: Managed Care, Other (non HMO) | Source: Ambulatory Visit | Attending: Obstetrics and Gynecology | Admitting: Obstetrics and Gynecology

## 2023-04-19 DIAGNOSIS — Z01419 Encounter for gynecological examination (general) (routine) without abnormal findings: Secondary | ICD-10-CM | POA: Insufficient documentation

## 2023-04-19 DIAGNOSIS — M8580 Other specified disorders of bone density and structure, unspecified site: Secondary | ICD-10-CM | POA: Insufficient documentation

## 2023-06-20 NOTE — Progress Notes (Deleted)
 GYNECOLOGY  VISIT   HPI: 53 y.o.   Married  Caucasian female   G2P2 with No LMP recorded. (Menstrual status: Oral contraceptives).   here for: 3 mo f/u     GYNECOLOGIC HISTORY: No LMP recorded. (Menstrual status: Oral contraceptives). Contraception:  OCP Menopausal hormone therapy:  n/a Last 2 paps:  02/21/23 neg: HR HPV neg, 12/04/20 neg : HR HPV neg  History of abnormal Pap or positive HPV:  no Mammogram:  12/30/22 Breast density Cat C, BI-RADS CAT 1 neg         OB History     Gravida  2   Para  2   Term      Preterm      AB      Living  2      SAB      IAB      Ectopic      Multiple      Live Births                 There are no active problems to display for this patient.   No past medical history on file.  Past Surgical History:  Procedure Laterality Date   peridontal surgery     peridontal surgery      Current Outpatient Medications  Medication Sig Dispense Refill   ACIDOPHILUS LACTOBACILLUS PO Take by mouth.     Ascorbic Acid (VITAMIN C PO) Take 1,000 mg by mouth. With mag,zinc     Biotin 5 MG TABS Take by mouth.     Cholecalciferol (VITAMIN D3 PO) Take by mouth.     desloratadine (CLARINEX) 5 MG tablet Take by mouth.     estradiol (VIVELLE-DOT) 0.05 MG/24HR patch Place 1 patch (0.05 mg total) onto the skin 2 (two) times a week. 24 patch 0   fexofenadine (ALLEGRA) 180 MG tablet Take by mouth.     Multiple Minerals-Vitamins (CALCIUM-MAGNESIUM-ZINC-D3 PO) Take by mouth.     Multiple Vitamin (MULTIVITAMIN) tablet Take 1 tablet by mouth daily.     progesterone (PROMETRIUM) 100 MG capsule Take one capsule (100 mg) at bedtime. 90 capsule 0   No current facility-administered medications for this visit.     ALLERGIES: Penicillins  Family History  Problem Relation Age of Onset   Stroke Mother    Diabetes Mother    Hypertension Mother    Stroke Maternal Grandfather     Social History   Socioeconomic History   Marital status: Married     Spouse name: Not on file   Number of children: Not on file   Years of education: Not on file   Highest education level: Not on file  Occupational History   Not on file  Tobacco Use   Smoking status: Never   Smokeless tobacco: Never  Vaping Use   Vaping status: Never Used  Substance and Sexual Activity   Alcohol use: Never   Drug use: Never   Sexual activity: Not Currently    Partners: Male    Birth control/protection: OCP    Comment: 1st intercourse- 20's, partners- 2, married- 21 yrs   Other Topics Concern   Not on file  Social History Narrative   Not on file   Social Drivers of Health   Financial Resource Strain: Low Risk  (04/04/2023)   Received from [facility]   Overall Financial Resource Strain (CARDIA)    Difficulty of Paying Living Expenses: Not hard at all  Food Insecurity: No Food Insecurity (  04/04/2023)   Received from [facility]   Hunger Vital Sign    Worried About Running Out of Food in the Last Year: Never true    Ran Out of Food in the Last Year: Never true  Transportation Needs: No Transportation Needs (04/04/2023)   Received from [facility] - Transportation    Lack of Transportation (Medical): No    Lack of Transportation (Non-Medical): No  Physical Activity: Sufficiently Active (04/04/2023)   Received from [facility]   Exercise Vital Sign    Days of Exercise per Week: 7 days    Minutes of Exercise per Session: 30 min  Stress: No Stress Concern Present (04/04/2023)   Received from [facility] - Occupational Stress Questionnaire    Feeling of Stress : Only a little  Social Connections: Moderately Integrated (04/04/2023)   Received from [facility]   Social Network    How would you rate your social network (family, work, friends)?: Adequate participation with social networks  Intimate Partner Violence: Not At Risk (04/04/2023)   Received from [facility]   HITS    Over the last 12  months how often did your partner physically hurt you?: Never    Over the last 12 months how often did your partner insult you or talk down to you?: Never    Over the last 12 months how often did your partner threaten you with physical harm?: Never    Over the last 12 months how often did your partner scream or curse at you?: Never    Review of Systems  PHYSICAL EXAMINATION:   There were no vitals taken for this visit.    General appearance: alert, cooperative and appears stated age Head: Normocephalic, without obvious abnormality, atraumatic Neck: no adenopathy, supple, symmetrical, trachea midline and thyroid normal to inspection and palpation Lungs: clear to auscultation bilaterally Breasts: normal appearance, no masses or tenderness, No nipple retraction or dimpling, No nipple discharge or bleeding, No axillary or supraclavicular adenopathy Heart: regular rate and rhythm Abdomen: soft, non-tender, no masses,  no organomegaly Extremities: extremities normal, atraumatic, no cyanosis or edema Skin: Skin color, texture, turgor normal. No rashes or lesions Lymph nodes: Cervical, supraclavicular, and axillary nodes normal. No abnormal inguinal nodes palpated Neurologic: Grossly normal  Pelvic: External genitalia:  no lesions              Urethra:  normal appearing urethra with no masses, tenderness or lesions              Bartholins and Skenes: normal                 Vagina: normal appearing vagina with normal color and discharge, no lesions              Cervix: no lesions                Bimanual Exam:  Uterus:  normal size, contour, position, consistency, mobility, non-tender              Adnexa: no mass, fullness, tenderness              Rectal exam: {yes no:314532}.  Confirms.              Anus:  normal sphincter tone, no lesions  Chaperone was present for exam:  {BSCHAPERONE:31226::"Shaka Zech F, CMA"}  ASSESSMENT:    PLAN:    {LABS (Optional):23779}  ***  total time was  spent for this patient encounter, including preparation, face-to-face counseling with the patient, coordination of care, and documentation of the encounter.

## 2023-06-29 ENCOUNTER — Other Ambulatory Visit: Payer: Self-pay

## 2023-06-29 MED ORDER — ESTRADIOL 0.05 MG/24HR TD PTTW: 1.0000 | MEDICATED_PATCH | TRANSDERMAL | 0 refills | Status: DC

## 2023-06-29 NOTE — Progress Notes (Unsigned)
GYNECOLOGY  VISIT   HPI: 53 y.o.   Married  Caucasian female   G2P2 with Patient's last menstrual period was 12/24/2021.   here for: 3 month follow up of HRT.   She is using Vivelle Dot 0.05 mg twice weekly and Prometrium 100 mg q hs.  Doing well with the hormone therapy and would like to continue.  Not having hot flashes or night sweats.   She wants to prevent osteoporosis.     Waking up at night to empty her bladder.   No vaginal bleeding.   GYNECOLOGIC HISTORY: Patient's last menstrual period was 12/24/2021. Contraception:  none. Menopausal hormone therapy:  n/a Last 2 paps:  02/21/23 neg: HR HPV neg, 12/04/20 neg : HR HPV neg  History of abnormal Pap or positive HPV:  no Mammogram:  12/30/22 Breast density Cat C, BI-RADS CAT 1 neg         OB History     Gravida  2   Para  2   Term      Preterm      AB      Living  2      SAB      IAB      Ectopic      Multiple      Live Births                 There are no active problems to display for this patient.   History reviewed. No pertinent past medical history.  Past Surgical History:  Procedure Laterality Date   peridontal surgery     peridontal surgery      Current Outpatient Medications  Medication Sig Dispense Refill   ACIDOPHILUS LACTOBACILLUS PO Take by mouth.     Ascorbic Acid (VITAMIN C PO) Take 1,000 mg by mouth. With mag,zinc     Biotin 5 MG TABS Take by mouth.     Cholecalciferol (VITAMIN D3 PO) Take by mouth.     desloratadine (CLARINEX) 5 MG tablet Take by mouth.     estradiol (VIVELLE-DOT) 0.05 MG/24HR patch Place 1 patch (0.05 mg total) onto the skin 2 (two) times a week. 8 patch 0   fexofenadine (ALLEGRA) 180 MG tablet Take by mouth.     Multiple Minerals-Vitamins (CALCIUM-MAGNESIUM-ZINC-D3 PO) Take by mouth.     Multiple Vitamin (MULTIVITAMIN) tablet Take 1 tablet by mouth daily.     progesterone (PROMETRIUM) 100 MG capsule Take one capsule (100 mg) at bedtime. 90 capsule 0    No current facility-administered medications for this visit.     ALLERGIES: Penicillins  Family History  Problem Relation Age of Onset   Stroke Mother    Diabetes Mother    Hypertension Mother    Stroke Maternal Grandfather     Social History   Socioeconomic History   Marital status: Married    Spouse name: Not on file   Number of children: Not on file   Years of education: Not on file   Highest education level: Not on file  Occupational History   Not on file  Tobacco Use   Smoking status: Never   Smokeless tobacco: Never  Vaping Use   Vaping status: Never Used  Substance and Sexual Activity   Alcohol use: Never   Drug use: Never   Sexual activity: Not Currently    Partners: Male    Birth control/protection: OCP    Comment: 1st intercourse- 20's, partners- 2, married- 21 yrs   Other Topics  Concern   Not on file  Social History Narrative   Not on file   Social Drivers of Health   Financial Resource Strain: Low Risk  (07/01/2023)   [facility]   Overall Financial Resource Strain (CARDIA)    Difficulty of Paying Living Expenses: Not hard at all  Food Insecurity: No Food Insecurity (07/01/2023)   Received from [facility]   Hunger Vital Sign    Worried About Running Out of Food in the Last Year: Never true    Ran Out of Food in the Last Year: Never true  Transportation Needs: No Transportation Needs (07/01/2023)   Received from [facility] - Transportation    Lack of Transportation (Medical): No    Lack of Transportation (Non-Medical): No  Physical Activity: Sufficiently Active (04/04/2023)   Received from [facility]   Exercise Vital Sign    Days of Exercise per Week: 7 days    Minutes of Exercise per Session: 30 min  Stress: No Stress Concern Present (04/04/2023)   Received from [facility] - Occupational Stress Questionnaire    Feeling of Stress : Only a little  Social  Connections: Moderately Integrated (04/04/2023)   Received from [facility]   Social Network    How would you rate your social network (family, work, friends)?: Adequate participation with social networks  Intimate Partner Violence: Not At Risk (04/04/2023)   Received from [facility]   HITS    Over the last 12 months how often did your partner physically hurt you?: Never    Over the last 12 months how often did your partner insult you or talk down to you?: Never    Over the last 12 months how often did your partner threaten you with physical harm?: Never    Over the last 12 months how often did your partner scream or curse at you?: Never    Review of Systems  All other systems reviewed and are negative.   PHYSICAL EXAMINATION:   BP 126/84 (BP Location: Left Arm, Patient Position: Sitting, Cuff Size: Small)   Ht 5' 4.25" (1.632 m)   Wt 118 lb (53.5 kg)   LMP 12/24/2021   BMI 20.10 kg/m     General appearance: alert, cooperative and appears stated age  ASSESSMENT:  HRT.  Doing well on current regimen.  Health education/osteoporosis prevention.   PLAN: Discused WHI and use of HRT which can increase risk of PE, DVT, MI, stroke and breast cancer.  We also reviewed benefits on improving well being, treating vasomotor symptoms, improving sleep quality, and reducing risk of osteoporosis. Continue Vivelle Dot 0.05 mg twice weekly and Prometrium 100 mg q hs.  Refills given until annual exam is due. I recommended regular weight bearing exercise and daily intake of 1200 mg of calcium and at least 600 - 800 internation units of vit D.  Follow up for annual exam in September, 2025.

## 2023-06-29 NOTE — Telephone Encounter (Signed)
Pt LVM in triage line stating that she had to r/s f/u appt w/ BS on 07/04/23 due to business trip. Now scheduled for 07/13/2023 but will need a refill on patches, states she only has a little over a weeks worth left.   Rx pend.

## 2023-06-30 NOTE — Telephone Encounter (Signed)
Pt notified and voiced understanding/appreciation for letting her know that the rx was sent.

## 2023-07-04 ENCOUNTER — Ambulatory Visit: Payer: Managed Care, Other (non HMO) | Admitting: Obstetrics and Gynecology

## 2023-07-13 ENCOUNTER — Ambulatory Visit (INDEPENDENT_AMBULATORY_CARE_PROVIDER_SITE_OTHER): Payer: Managed Care, Other (non HMO) | Admitting: Obstetrics and Gynecology

## 2023-07-13 ENCOUNTER — Encounter: Payer: Self-pay | Admitting: Obstetrics and Gynecology

## 2023-07-13 VITALS — BP 126/84 | Ht 64.25 in | Wt 118.0 lb

## 2023-07-13 DIAGNOSIS — Z79890 Hormone replacement therapy (postmenopausal): Secondary | ICD-10-CM

## 2023-07-13 DIAGNOSIS — Z719 Counseling, unspecified: Secondary | ICD-10-CM

## 2023-07-13 MED ORDER — ESTRADIOL 0.05 MG/24HR TD PTTW: 1.0000 | MEDICATED_PATCH | TRANSDERMAL | 2 refills | Status: DC

## 2023-07-13 MED ORDER — PROGESTERONE MICRONIZED 100 MG PO CAPS: ORAL_CAPSULE | ORAL | 2 refills | Status: DC

## 2023-07-15 NOTE — Patient Instructions (Signed)
Preventing Osteoporosis, Adult Osteoporosis is a condition that causes the bones to lose density. This means that the bones become thinner, and the normal spaces in bone tissue become larger. Low bone density can make the bones weak and cause them to break more easily. Osteoporosis cannot always be prevented, but you can take steps to lower your risk of developing this condition. How can this condition affect me? If you develop osteoporosis, you will be more likely to break bones in your wrist, spine, or hip. Even a minor accident or injury can be enough to break weak bones. The bones will also be slower to heal. Osteoporosis can cause other problems as well, such as a stooped posture or trouble with movement. Osteoporosis can occur with aging. As you get older, you may lose bone tissue more quickly, or it may be replaced more slowly. Osteoporosis is more likely to develop if you have poor nutrition or do not get enough calcium or vitamin D. Other lifestyle factors can also play a role. By eating a well-balanced diet and making lifestyle changes, you can help keep your bones strong and healthy, lowering your chances of developing osteoporosis. What can increase my risk? The following factors may make you more likely to develop osteoporosis: Having a family history of the condition. Having poor nutrition or not getting enough calcium or vitamin D. Using certain medicines, such as steroid medicines or anti-seizure medicines. Being any of the following: 59 years of age or older. Female. A woman who has gone through menopause (is postmenopausal). A person who is of European or Asian descent. Using products that contain nicotine or tobacco, such as cigarettes, e-cigarettes, and chewing tobacco. Not being physically active (being sedentary). Having a small body frame. What actions can I take to prevent this? Get enough calcium  Make sure you get enough calcium every day. Calcium is the most important  mineral for bone health. Most people can get enough calcium from their diet, but supplements may be recommended for people who are at risk for osteoporosis. Follow these guidelines: If you are age 31 or younger, aim to get 1,000 milligrams (mg) of calcium every day. If you are older than age 52, aim to get 1,200 mg of calcium every day. Good sources of calcium include: Dairy products, such as low-fat or nonfat milk, cheese, and yogurt. Dark green leafy vegetables, such as bok choy and broccoli. Foods that have had calcium added to them (calcium-fortified foods), such as orange juice, cereal, bread, soy beverages, and tofu products. Nuts, such as almonds. Check nutrition labels to see how much calcium is in a food or drink. Get enough vitamin D Try to get enough vitamin D every day. Vitamin D is the most essential vitamin for bone health. It helps the body absorb calcium. Follow these guidelines for how much vitamin D to get from food: If you are age 36 or younger, aim to get at least 600 international units (IU) every day. Your health care provider may suggest more. If you are older than age 67, aim to get at least 800 international units every day. Your health care provider may suggest more. Good sources of vitamin D in your diet include: Egg yolks. Oily fish, such as salmon, sardines, and tuna. Milk and cereal fortified with vitamin D. Your body also makes vitamin D when you are out in the sun. Exposing the bare skin on your face, arms, legs, or back to the sun for no more than 30 minutes a  day, 2 times a week is more than enough. Beyond that, make sure you use sunblock to protect your skin from sunburn, which increases your risk for skin cancer. Exercise  Stay active and get exercise every day. Ask your health care provider what types of exercise are best for you. Weight-bearing and strength-building activities are important for building and maintaining healthy bones. Some examples of these  types of activities include: Walking and hiking. Jogging and running. Dancing. Gym exercises and lifting weights. Tennis and racquetball. Climbing stairs. Tai chi. Make other lifestyle changes Do not use any products that contain nicotine or tobacco, such as cigarettes, e-cigarettes, and chewing tobacco. If you need help quitting, ask your health care provider. Lose weight if you are overweight. If you drink alcohol: Limit how much you use to: 0-1 drink a day for women who are not pregnant. 0-2 drinks a day for men. Be aware of how much alcohol is in your drink. In the [place] one drink equals one 12 oz bottle of beer (355 mL), one 5 oz glass of wine (148 mL), or one 1 oz glass of hard liquor (44 mL). Where to find support If you need help making changes to prevent osteoporosis, talk with your health care provider. You can ask for a referral to a dietitian and a physical therapist. Where to find more information Learn more about osteoporosis from: NIH Osteoporosis and Related Bone Diseases National Resource Center: www.bones.http://www.myers.net/ [place] Office on [employer] Health: http://hoffman.com/ National Osteoporosis Foundation: [employer] Summary Osteoporosis is a condition that causes weak bones that are more likely to break. Eat a healthy diet, making sure you get enough calcium and vitamin D, and stay active by getting regular exercise to help prevent osteoporosis. Other ways to reduce your risk of osteoporosis include maintaining a healthy weight and avoiding alcohol and products that contain nicotine or tobacco. This information is not intended to replace advice given to you by your health care provider. Make sure you discuss any questions you have with your health care provider. Document Revised: 01/18/2023 Document Reviewed: 01/18/2023 Elsevier Patient Education  2024 [employer].

## 2024-02-15 ENCOUNTER — Other Ambulatory Visit: Payer: Self-pay | Admitting: Obstetrics and Gynecology

## 2024-02-15 DIAGNOSIS — Z1231 Encounter for screening mammogram for malignant neoplasm of breast: Secondary | ICD-10-CM

## 2024-02-22 ENCOUNTER — Ambulatory Visit (INDEPENDENT_AMBULATORY_CARE_PROVIDER_SITE_OTHER): Payer: Managed Care, Other (non HMO) | Admitting: Obstetrics and Gynecology

## 2024-02-22 ENCOUNTER — Encounter: Payer: Self-pay | Admitting: Obstetrics and Gynecology

## 2024-02-22 VITALS — BP 110/70 | HR 67 | Ht 64.0 in | Wt 113.6 lb

## 2024-02-22 DIAGNOSIS — M8580 Other specified disorders of bone density and structure, unspecified site: Secondary | ICD-10-CM | POA: Diagnosis not present

## 2024-02-22 DIAGNOSIS — Z1231 Encounter for screening mammogram for malignant neoplasm of breast: Secondary | ICD-10-CM

## 2024-02-22 DIAGNOSIS — Z1331 Encounter for screening for depression: Secondary | ICD-10-CM | POA: Diagnosis not present

## 2024-02-22 DIAGNOSIS — E2839 Other primary ovarian failure: Secondary | ICD-10-CM

## 2024-02-22 DIAGNOSIS — Z01419 Encounter for gynecological examination (general) (routine) without abnormal findings: Secondary | ICD-10-CM

## 2024-02-22 MED ORDER — PROGESTERONE MICRONIZED 100 MG PO CAPS: ORAL_CAPSULE | ORAL | 2 refills | Status: AC

## 2024-02-22 MED ORDER — ESTRADIOL 0.05 MG/24HR TD PTTW: 1.0000 | MEDICATED_PATCH | TRANSDERMAL | 2 refills | Status: AC

## 2024-02-22 NOTE — Addendum Note (Signed)
 Addended by: GLENNON ALMARIE POUR on: 02/22/2024 02:41 PM   Modules accepted: Orders

## 2024-02-22 NOTE — Progress Notes (Signed)
 53 y.o. y.o. female here for annual exam. Patient's last menstrual period was 12/24/2021.   Married  Caucasian  female   G2P2 with No LMP recorded. (Menstrual status: Oral contraceptives).   here for established annual exam. She is on estrogen patch 0.05 twice weekly and prometrium  at night and feels like this is working well.   03/14/23: FSH 141.7 and estradiol  <15.  Feels like the toxic environment has changed at worked, and this has reduced her stress.   Concerned about her osteopenia.  Mother had osteoporosis.  2024 osteopenia -1.6 frax 11.1, 0.5% repeat in 2 years no fractures 2022 -1.9 frax 9.8, 0.7% No PMB Last mammogram: 01/02/23 Last colonoscopy: cologuard 2024 in [facility] Labs: with PCP No pelvic pain or abnormal discharge Body mass index is 19.5 kg/m.     02/22/2024    8:09 AM  Depression screen PHQ 2/9  Decreased Interest 0  Down, Depressed, Hopeless 0  PHQ - 2 Score 0    Blood pressure 110/70, pulse 67, height 5' 4 (1.626 m), weight 113 lb 9.6 oz (51.5 kg), last menstrual period 12/24/2021, SpO2 97%.     Component Value Date/Time   DIAGPAP  02/21/2023 0854    - Negative for intraepithelial lesion or malignancy (NILM)   DIAGPAP  12/04/2020 1627    - Negative for intraepithelial lesion or malignancy (NILM)   HPVHIGH Negative 02/21/2023 0854   HPVHIGH Negative 12/04/2020 1627   ADEQPAP  02/21/2023 0854    Satisfactory for evaluation; transformation zone component ABSENT.   ADEQPAP  12/04/2020 1627    Satisfactory for evaluation; transformation zone component PRESENT.    GYN HISTORY:    Component Value Date/Time   DIAGPAP  02/21/2023 0854    - Negative for intraepithelial lesion or malignancy (NILM)   DIAGPAP  12/04/2020 1627    - Negative for intraepithelial lesion or malignancy (NILM)   HPVHIGH Negative 02/21/2023 0854   HPVHIGH Negative 12/04/2020 1627   ADEQPAP  02/21/2023 0854    Satisfactory for evaluation; transformation zone  component ABSENT.   ADEQPAP  12/04/2020 1627    Satisfactory for evaluation; transformation zone component PRESENT.    OB History  Gravida Para Term Preterm AB Living  2 2    2   SAB IAB Ectopic Multiple Live Births          # Outcome Date GA Lbr Len/2nd Weight Sex Type Anes PTL Lv  2 Para           1 Para             No past medical history on file.  Past Surgical History:  Procedure Laterality Date   peridontal surgery     peridontal surgery      Current Outpatient Medications on File Prior to Visit  Medication Sig Dispense Refill   ACIDOPHILUS LACTOBACILLUS PO Take by mouth.     Ascorbic Acid (VITAMIN C PO) Take 1,000 mg by mouth. With mag,zinc     Biotin 5 MG TABS Take by mouth.     Cholecalciferol (VITAMIN D3 PO) Take by mouth.     estradiol  (VIVELLE -DOT) 0.05 MG/24HR patch Place 1 patch (0.05 mg total) onto the skin 2 (two) times a week. 24 patch 2   fexofenadine (ALLEGRA) 180 MG tablet Take by mouth.     Multiple Minerals-Vitamins (CALCIUM-MAGNESIUM-ZINC-D3 PO) Take by mouth.     progesterone  (PROMETRIUM ) 100 MG capsule Take one capsule (100 mg) at bedtime. 90 capsule 2  triamcinolone ointment (KENALOG) 0.5 % Apply topically 2 (two) times daily as needed.     desloratadine (CLARINEX) 5 MG tablet Take by mouth. (Patient not taking: Reported on 02/22/2024)     No current facility-administered medications on file prior to visit.    Social History   Socioeconomic History   Marital status: Married    Spouse name: Not on file   Number of children: Not on file   Years of education: Not on file   Highest education level: Not on file  Occupational History   Not on file  Tobacco Use   Smoking status: Never   Smokeless tobacco: Never  Vaping Use   Vaping status: Never Used  Substance and Sexual Activity   Alcohol use: Never   Drug use: Never   Sexual activity: Not Currently    Partners: Male    Birth control/protection: OCP    Comment: 1st intercourse- 20's,  partners- 2, married- 21 yrs   Other Topics Concern   Not on file  Social History Narrative   Not on file   Social Drivers of Health   Financial Resource Strain: Low Risk  (11/17/2023)   Received from [facility]   Overall Financial Resource Strain (CARDIA)    Difficulty of Paying Living Expenses: Not hard at all  Food Insecurity: No Food Insecurity (11/17/2023)   Received from [facility]   Hunger Vital Sign    Within the past 12 months, you worried that your food would run out before you got the money to buy more.: Never true    Within the past 12 months, the food you bought just didn't last and you didn't have money to get more.: Never true  Transportation Needs: No Transportation Needs (11/17/2023)   Received from [facility] - Transportation    Lack of Transportation (Medical): No    Lack of Transportation (Non-Medical): No  Physical Activity: Sufficiently Active (11/17/2023)   Received from [facility]   Exercise Vital Sign    On average, how many days per week do you engage in moderate to strenuous exercise (like a brisk walk)?: 5 days    On average, how many minutes do you engage in exercise at this level?: 30 min  Stress: No Stress Concern Present (11/17/2023)   Received from [facility] - Occupational Stress Questionnaire    Feeling of Stress : Only a little  Social Connections: Socially Integrated (11/17/2023)   Received from [facility]   Social Network    How would you rate your social network (family, work, friends)?: Good participation with social networks  Intimate Partner Violence: Not At Risk (11/17/2023)   Received from [facility]   HITS    Over the last 12 months how often did your partner physically hurt you?: Never    Over the last 12 months how often did your partner insult you or talk down to you?: Never    Over the last 12 months how often did your partner threaten you with physical harm?:  Never    Over the last 12 months how often did your partner scream or curse at you?: Never    Family History  Problem Relation Age of Onset   Stroke Mother    Diabetes Mother    Hypertension Mother    Stroke Maternal Grandfather      Allergies  Allergen Reactions   Penicillins Rash      Patient's last menstrual  period was Patient's last menstrual period was 12/24/2021.Kristi [name]            Review of Systems Alls systems reviewed and are negative.     Physical Exam Constitutional:      Appearance: Normal appearance.  Genitourinary:     Vulva and urethral meatus normal.     No lesions in the vagina.     Right Labia: No rash, lesions or skin changes.    Left Labia: No lesions, skin changes or rash.    No vaginal discharge or tenderness.     No vaginal prolapse present.    No vaginal atrophy present.     Right Adnexa: not tender, not palpable and no mass present.    Left Adnexa: not tender, not palpable and no mass present.    No cervical motion tenderness or discharge.     Uterus is not enlarged, tender or irregular.  Breasts:    Right: Normal.     Left: Normal.  HENT:     Head: Normocephalic.  Neck:     Thyroid: No thyroid mass, thyromegaly or thyroid tenderness.  Cardiovascular:     Rate and Rhythm: Normal rate and regular rhythm.     Heart sounds: Normal heart sounds, S1 normal and S2 normal.  Pulmonary:     Effort: Pulmonary effort is normal.     Breath sounds: Normal breath sounds and air entry.  Abdominal:     General: There is no distension.     Palpations: Abdomen is soft. There is no mass.     Tenderness: There is no abdominal tenderness. There is no guarding or rebound.  Musculoskeletal:        General: Normal range of motion.     Cervical back: Full passive range of motion without pain, normal range of motion and neck supple. No tenderness.     Right lower leg: No edema.     Left lower leg: No edema.  Neurological:     Mental Status: She is alert.   Skin:    General: Skin is warm.  Psychiatric:        Mood and Affect: Mood normal.        Behavior: Behavior normal.        Thought Content: Thought content normal.  Vitals and nursing note reviewed. Exam conducted with a chaperone present.       A:         Well Woman GYN exam                             P:        Pap smear not indicated Encouraged annual mammogram screening Colon cancer screening up-to-date DXA ordered today repeat in 2026 Labs and immunizations to do with PMD Discussed breast self exams Encouraged healthy lifestyle practices Encouraged Vit D and Calcium   No follow-ups on file.  Kristi [name]

## 2024-02-29 ENCOUNTER — Ambulatory Visit
Admission: RE | Admit: 2024-02-29 | Discharge: 2024-02-29 | Disposition: A | Discharge status: Home / Self Care | Source: Ambulatory Visit | Attending: Obstetrics and Gynecology | Admitting: Obstetrics and Gynecology

## 2024-02-29 DIAGNOSIS — Z1231 Encounter for screening mammogram for malignant neoplasm of breast: Secondary | ICD-10-CM

## 2024-03-05 ENCOUNTER — Ambulatory Visit: Payer: Self-pay | Admitting: Obstetrics and Gynecology

## 2025-02-26 ENCOUNTER — Ambulatory Visit: Admitting: Obstetrics and Gynecology
